# Patient Record
Sex: Female | Born: 1935 | Race: White | Hispanic: No | Smoking: Never smoker
Health system: Southern US, Community
[De-identification: ages and names within clinical notes are randomized; demographics above are authoritative.]

## PROBLEM LIST (undated history)

## (undated) DIAGNOSIS — I4821 Permanent atrial fibrillation: Secondary | ICD-10-CM

## (undated) DIAGNOSIS — G20A1 Parkinson's disease without dyskinesia, without mention of fluctuations: Secondary | ICD-10-CM

## (undated) DIAGNOSIS — Z8781 Personal history of (healed) traumatic fracture: Secondary | ICD-10-CM

## (undated) DIAGNOSIS — Z8489 Family history of other specified conditions: Secondary | ICD-10-CM

## (undated) DIAGNOSIS — M1990 Unspecified osteoarthritis, unspecified site: Secondary | ICD-10-CM

## (undated) DIAGNOSIS — I10 Essential (primary) hypertension: Secondary | ICD-10-CM

## (undated) DIAGNOSIS — G20 Parkinson's disease: Secondary | ICD-10-CM

## (undated) DIAGNOSIS — G8929 Other chronic pain: Secondary | ICD-10-CM

## (undated) DIAGNOSIS — E7800 Pure hypercholesterolemia, unspecified: Secondary | ICD-10-CM

## (undated) DIAGNOSIS — R251 Tremor, unspecified: Secondary | ICD-10-CM

## (undated) DIAGNOSIS — I4891 Unspecified atrial fibrillation: Secondary | ICD-10-CM

## (undated) DIAGNOSIS — Z8619 Personal history of other infectious and parasitic diseases: Secondary | ICD-10-CM

## (undated) DIAGNOSIS — M549 Dorsalgia, unspecified: Secondary | ICD-10-CM

## (undated) HISTORY — DX: Personal history of other infectious and parasitic diseases: Z86.19

## (undated) HISTORY — PX: CATARACT EXTRACTION W/ INTRAOCULAR LENS  IMPLANT, BILATERAL: SHX1307

---
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
## 2009-08-26 DIAGNOSIS — Z8781 Personal history of (healed) traumatic fracture: Secondary | ICD-10-CM

## 2009-08-26 HISTORY — DX: Personal history of (healed) traumatic fracture: Z87.81

## 2013-05-11 ENCOUNTER — Emergency Department (HOSPITAL_COMMUNITY): Payer: Medicare Other

## 2013-05-11 ENCOUNTER — Emergency Department (HOSPITAL_COMMUNITY)
Admission: EM | Admit: 2013-05-11 | Discharge: 2013-05-11 | Disposition: A | Discharge status: Home / Self Care | Payer: Medicare Other | Attending: Emergency Medicine | Admitting: Emergency Medicine

## 2013-05-11 ENCOUNTER — Encounter (HOSPITAL_COMMUNITY): Payer: Self-pay | Admitting: Emergency Medicine

## 2013-05-11 DIAGNOSIS — E7800 Pure hypercholesterolemia, unspecified: Secondary | ICD-10-CM | POA: Insufficient documentation

## 2013-05-11 DIAGNOSIS — IMO0002 Reserved for concepts with insufficient information to code with codable children: Secondary | ICD-10-CM | POA: Insufficient documentation

## 2013-05-11 DIAGNOSIS — Y92009 Unspecified place in unspecified non-institutional (private) residence as the place of occurrence of the external cause: Secondary | ICD-10-CM | POA: Insufficient documentation

## 2013-05-11 DIAGNOSIS — Z8781 Personal history of (healed) traumatic fracture: Secondary | ICD-10-CM | POA: Insufficient documentation

## 2013-05-11 DIAGNOSIS — S0003XA Contusion of scalp, initial encounter: Secondary | ICD-10-CM | POA: Insufficient documentation

## 2013-05-11 DIAGNOSIS — I10 Essential (primary) hypertension: Secondary | ICD-10-CM | POA: Insufficient documentation

## 2013-05-11 DIAGNOSIS — Y939 Activity, unspecified: Secondary | ICD-10-CM | POA: Insufficient documentation

## 2013-05-11 DIAGNOSIS — W1809XA Striking against other object with subsequent fall, initial encounter: Secondary | ICD-10-CM | POA: Insufficient documentation

## 2013-05-11 DIAGNOSIS — Z79899 Other long term (current) drug therapy: Secondary | ICD-10-CM | POA: Insufficient documentation

## 2013-05-11 DIAGNOSIS — W19XXXA Unspecified fall, initial encounter: Secondary | ICD-10-CM

## 2013-05-11 DIAGNOSIS — Z7982 Long term (current) use of aspirin: Secondary | ICD-10-CM | POA: Insufficient documentation

## 2013-05-11 HISTORY — DX: Essential (primary) hypertension: I10

## 2013-05-11 HISTORY — DX: Pure hypercholesterolemia, unspecified: E78.00

## 2013-05-11 HISTORY — DX: Tremor, unspecified: R25.1

## 2013-05-11 HISTORY — DX: Personal history of (healed) traumatic fracture: Z87.81

## 2013-05-11 NOTE — ED Notes (Signed)
Dressing and bacitracin placed on head and L elbow after cleaning with saline; bacitracin and bandaid placed on R elbow

## 2013-05-11 NOTE — ED Notes (Signed)
Pt from home c/o fall from standing on concrete. Per ems pt has "goose egg" on occipital region, and scrapes to elbows. Pt ambulatory on scene, denies loc.

## 2013-05-11 NOTE — ED Provider Notes (Signed)
CSN: [id number]     Arrival date & time 05/11/13  1420 History   First MD Initiated Contact with Patient 05/11/13 1508     Chief Complaint  Patient presents with  . Fall   (Consider location/radiation/quality/duration/timing/severity/associated sxs/prior Treatment) HPI.... accidental fall striking the back of head on concrete a brief time ago.  No loss of consciousness or neuro deficits.  Severity is mild. Palpation makes symptoms worse.  No neck pain, hip pain, extremity pain.  Daughter-in-law confirms normal behavior  Past Medical History  Diagnosis Date  . Hypertension   . Tremor   . High cholesterol   . History of pelvic fracture    History reviewed. No pertinent past surgical history. History reviewed. No pertinent family history. History  Substance Use Topics  . Smoking status: Never Smoker   . Smokeless tobacco: Not on file  . Alcohol Use: No   OB History   Grav Para Term Preterm Abortions TAB SAB Ect Mult Living                 Review of Systems  All other systems reviewed and are negative.    Allergies  Review of patient's allergies indicates no known allergies.  Home Medications   Current Outpatient Rx  Name  Route  Sig  Dispense  Refill  . aspirin 81 MG tablet   Oral   Take 81 mg by mouth daily.         . benazepril-hydrochlorthiazide (LOTENSIN HCT) 20-25 MG per tablet   Oral   Take 1 tablet by mouth daily.         . Cholecalciferol (VITAMIN D-3 PO)   Oral   Take 1 tablet by mouth daily.         . metoprolol succinate (TOPROL-XL) 100 MG 24 hr tablet   Oral   Take 100 mg by mouth daily. Take with or immediately following a meal.         . Omega-3 Fatty Acids (FISH OIL PO)   Oral   Take 1 capsule by mouth daily.         . primidone (MYSOLINE) 50 MG tablet   Oral   Take 100 mg by mouth daily.         . simvastatin (ZOCOR) 20 MG tablet   Oral   Take 20 mg by mouth at bedtime.          BP 163/96  Pulse 33  Temp(Src) 97.6 F  (36.4 C) (Oral)  Resp 18  SpO2 87% Physical Exam  Nursing note and vitals reviewed. Constitutional: She is oriented to person, place, and time. She appears well-developed and well-nourished.  HENT:  Head: Normocephalic and atraumatic.  Right occipital hematoma approximately 2.5 cm in diameter  Eyes: Conjunctivae and EOM are normal. Pupils are equal, round, and reactive to light.  Neck: Normal range of motion. Neck supple.  Cardiovascular: Normal rate, regular rhythm and normal heart sounds.   Pulmonary/Chest: Effort normal and breath sounds normal.  Abdominal: Soft. Bowel sounds are normal.  Musculoskeletal: Normal range of motion.  Neurological: She is alert and oriented to person, place, and time.  Skin:  Superficial abrasions bilateral elbows  Psychiatric: She has a normal mood and affect.    ED Course  Procedures (including critical care time)..... procedure note:   Occipital wound examined. Hair trimmed.  No suture necessary. Turban dressing applied  Labs Review Labs Reviewed - No data to display Imaging Review No results found.  Date: 05/11/2013  Rate: 83  Rhythm: a fib  QRS Axis: normal  Intervals: normal  ST/T Wave abnormalities: normal  Conduction Disutrbances: none  Narrative Interpretation: unremarkable  Ct Head Wo Contrast  05/11/2013   CLINICAL DATA:  79 year old female status post fall with occipital hematoma.  EXAM: CT HEAD WITHOUT CONTRAST  TECHNIQUE: Contiguous axial images were obtained from the base of the skull through the vertex without intravenous contrast.  COMPARISON:  None.  FINDINGS: Posterior scalp hematoma to the right of midline measuring up to 15 mm in thickness. Small volume associated subcutaneous gas. Underlying calvarium intact. Osteopenia throughout the visible skull. No definite suspicious osseous lesion.  Scalp and orbits soft tissues elsewhere are within normal limits. Left greater than right frontal sinus mucosal thickening. Other  Visualized paranasal sinuses and mastoids are clear.  Calcified atherosclerosis at the skull base. Mild intracranial artery dolichoectasia. Patchy in confluent cerebral white matter hypodensity. No ventriculomegaly. No midline shift, mass effect, or evidence of intracranial mass lesion. No acute intracranial hemorrhage identified. No evidence of cortically based acute infarction identified. No suspicious intracranial vascular hyperdensity.  IMPRESSION: 1. Scalp hematoma/laceration without underlying fracture.  2. Nonspecific cerebral white matter changes. No acute intracranial abnormality.   Electronically Signed   By: Augusto Gamble M.D.   On: 05/11/2013 15:58    MDM  No diagnosis found. Patient is alert and oriented x3. CT scan of head negative for subdural hematoma.  Turban dressing applied. No neck pain    Donnetta Hutching, MD 05/11/13 [id number]

## 2013-05-11 NOTE — ED Notes (Signed)
Dr Adriana Simas at bedside updating pt

## 2013-05-11 NOTE — ED Notes (Signed)
Pt to CT via stretcher with transport.

## 2013-05-11 NOTE — ED Notes (Signed)
Notified Dr Adriana Simas that supplies at bedside

## 2013-05-11 NOTE — ED Notes (Signed)
Daughter called ,patient is experiencing new symptons.Nausea Dizziness and off balance.Current scriber informed her to bring patient back for recheck per Dr Adriana Simas.

## 2013-05-13 ENCOUNTER — Encounter (HOSPITAL_COMMUNITY): Payer: Self-pay | Admitting: *Deleted

## 2013-05-13 ENCOUNTER — Emergency Department (HOSPITAL_COMMUNITY): Payer: Medicare Other

## 2013-05-13 ENCOUNTER — Emergency Department (HOSPITAL_COMMUNITY)
Admission: EM | Admit: 2013-05-13 | Discharge: 2013-05-13 | Disposition: A | Discharge status: Home / Self Care | Payer: Medicare Other | Attending: Emergency Medicine | Admitting: Emergency Medicine

## 2013-05-13 DIAGNOSIS — Y929 Unspecified place or not applicable: Secondary | ICD-10-CM | POA: Insufficient documentation

## 2013-05-13 DIAGNOSIS — IMO0002 Reserved for concepts with insufficient information to code with codable children: Secondary | ICD-10-CM | POA: Insufficient documentation

## 2013-05-13 DIAGNOSIS — Y9389 Activity, other specified: Secondary | ICD-10-CM | POA: Insufficient documentation

## 2013-05-13 DIAGNOSIS — I10 Essential (primary) hypertension: Secondary | ICD-10-CM | POA: Insufficient documentation

## 2013-05-13 DIAGNOSIS — M545 Low back pain: Secondary | ICD-10-CM

## 2013-05-13 DIAGNOSIS — W19XXXA Unspecified fall, initial encounter: Secondary | ICD-10-CM | POA: Insufficient documentation

## 2013-05-13 DIAGNOSIS — Z79899 Other long term (current) drug therapy: Secondary | ICD-10-CM | POA: Insufficient documentation

## 2013-05-13 DIAGNOSIS — Z8781 Personal history of (healed) traumatic fracture: Secondary | ICD-10-CM | POA: Insufficient documentation

## 2013-05-13 DIAGNOSIS — R259 Unspecified abnormal involuntary movements: Secondary | ICD-10-CM | POA: Insufficient documentation

## 2013-05-13 DIAGNOSIS — E7800 Pure hypercholesterolemia, unspecified: Secondary | ICD-10-CM | POA: Insufficient documentation

## 2013-05-13 DIAGNOSIS — Z7982 Long term (current) use of aspirin: Secondary | ICD-10-CM | POA: Insufficient documentation

## 2013-05-13 NOTE — ED Notes (Signed)
Pt eating sandwich brought in by daughter, pt asked to refrain from eating until we have xray results back. Pt and daughter stated understanding.

## 2013-05-13 NOTE — ED Notes (Signed)
Pt reports being here on Tuesday following a fall, pt was seen here and sent home but pt reports that she is now having lower back pain that was not evaluated on Tuesday. No acute distress noted at triage.

## 2013-05-13 NOTE — ED Notes (Signed)
Nurse tech notified this RN that pt was found eating in room.  This RN reminded family members again of the need to refrain until we have results. Family members verbalized understanding.

## 2013-05-13 NOTE — ED Notes (Signed)
PT and pt's daughter comfortable with d/c instructions. Pt ambulatory from bed to wheelchair with cane without assistance. No prescriptions.

## 2013-05-13 NOTE — ED Provider Notes (Signed)
CSN: [id number]     Arrival date & time 05/13/13  4098 History   First MD Initiated Contact with Patient 05/13/13 1119     Chief Complaint  Patient presents with  . Fall  . Back Pain   (Consider location/radiation/quality/duration/timing/severity/associated sxs/prior Treatment) HPI.... patient evaluated in emergency department on 05/11/2013 by examiner status post fall.   She had a 2.5 cm occipital hematoma. CT scan of head was negative.   She now complains of sharp low back pain. She admits that her back was not bothering her 2 days ago.   No neurological deficits, neck pain, extremity pain.   There is mild to moderate. No radicular symptoms.   Past Medical History  Diagnosis Date  . Hypertension   . Tremor   . High cholesterol   . History of pelvic fracture    History reviewed. No pertinent past surgical history. History reviewed. No pertinent family history. History  Substance Use Topics  . Smoking status: Never Smoker   . Smokeless tobacco: Not on file  . Alcohol Use: No   OB History   Grav Para Term Preterm Abortions TAB SAB Ect Mult Living                 Review of Systems  All other systems reviewed and are negative.    Allergies  Review of patient's allergies indicates no known allergies.  Home Medications   Current Outpatient Rx  Name  Route  Sig  Dispense  Refill  . aspirin 81 MG tablet   Oral   Take 81 mg by mouth daily.         . benazepril-hydrochlorthiazide (LOTENSIN HCT) 20-25 MG per tablet   Oral   Take 1 tablet by mouth daily.         . Cholecalciferol (VITAMIN D-3 PO)   Oral   Take 1 tablet by mouth daily.         . metoprolol succinate (TOPROL-XL) 100 MG 24 hr tablet   Oral   Take 100 mg by mouth daily. Take with or immediately following a meal.         . Omega-3 Fatty Acids (FISH OIL PO)   Oral   Take 1 capsule by mouth daily.         . primidone (MYSOLINE) 50 MG tablet   Oral   Take 100 mg by mouth daily.         .  simvastatin (ZOCOR) 20 MG tablet   Oral   Take 20 mg by mouth at bedtime.          BP 145/90  Pulse 90  Temp(Src) 98.1 F (36.7 C) (Oral)  Resp 14  SpO2 96% Physical Exam  Nursing note and vitals reviewed. Constitutional: She is oriented to person, place, and time. She appears well-developed and well-nourished.  HENT:  Head: Normocephalic and atraumatic.  Eyes: Conjunctivae and EOM are normal. Pupils are equal, round, and reactive to light.  Neck: Normal range of motion. Neck supple.  Cardiovascular: Normal rate, regular rhythm and normal heart sounds.   Pulmonary/Chest: Effort normal and breath sounds normal.  Abdominal: Soft. Bowel sounds are normal.  Musculoskeletal:  Minimal tenderness distal sacrum, proximal coccyx.   No thoracic tenderness.  Neurological: She is alert and oriented to person, place, and time.  Skin: Skin is warm and dry.  Psychiatric: She has a normal mood and affect.    ED Course  Procedures (including critical care time) Labs Review Labs Reviewed -  No data to display Imaging Review Dg Lumbar Spine Complete  05/13/2013   CLINICAL DATA:  Fall, low back pain  EXAM: LUMBAR SPINE - COMPLETE 4+ VIEW  COMPARISON:  None.  FINDINGS: Five lumbar type vertebral bodies.  Lumbar levoscoliosis.  No evidence of fracture or dislocation. Lumbar vertebral body heights are maintained.  Multiple compression deformities are suspected in the lower thoracic spine, at T8 and T10-12, age indeterminate but likely chronic.  Moderate multilevel degenerative changes. Pars defects are possible at L5-S1.  Vascular calcifications.  IMPRESSION: No evidence of fracture or dislocation in the lumbar spine.  Multiple compression deformities are suspected in the lower thoracic spine, at T8 and T10-12, age-indeterminate but likely chronic.  Moderate multilevel degenerative changes.   Electronically Signed   By: Charline Bills M.D.   On: 05/13/2013 13:22   Dg Sacrum/coccyx  05/13/2013    CLINICAL DATA:  Fall, low back pain  EXAM: SACRUM AND COCCYX - 2+ VIEW  COMPARISON:  None.  FINDINGS: Deformity of the right inferior pubic ramus, likely reflecting prior/ chronic fracture.  No evidence of acute fracture or dislocation.  Visualized bony pelvis appears intact.  Mild degenerative changes of the bilateral hips.  Vascular calcifications  IMPRESSION: No evidence of acute fracture dislocation.  Deformity related to prior/chronic fracture of the right inferior pubic ramus.   Electronically Signed   By: Charline Bills M.D.   On: 05/13/2013 13:19   Ct Head Wo Contrast  05/11/2013   CLINICAL DATA:  79 year old female status post fall with occipital hematoma.  EXAM: CT HEAD WITHOUT CONTRAST  TECHNIQUE: Contiguous axial images were obtained from the base of the skull through the vertex without intravenous contrast.  COMPARISON:  None.  FINDINGS: Posterior scalp hematoma to the right of midline measuring up to 15 mm in thickness. Small volume associated subcutaneous gas. Underlying calvarium intact. Osteopenia throughout the visible skull. No definite suspicious osseous lesion.  Scalp and orbits soft tissues elsewhere are within normal limits. Left greater than right frontal sinus mucosal thickening. Other Visualized paranasal sinuses and mastoids are clear.  Calcified atherosclerosis at the skull base. Mild intracranial artery dolichoectasia. Patchy in confluent cerebral white matter hypodensity. No ventriculomegaly. No midline shift, mass effect, or evidence of intracranial mass lesion. No acute intracranial hemorrhage identified. No evidence of cortically based acute infarction identified. No suspicious intracranial vascular hyperdensity.  IMPRESSION: 1. Scalp hematoma/laceration without underlying fracture.  2. Nonspecific cerebral white matter changes. No acute intracranial abnormality.   Electronically Signed   By: Augusto Gamble M.D.   On: 05/11/2013 15:58    MDM   1. Low back pain   Plain films of  lumbar spine and sacrum reveal no acute fractures. Old compression fractures of T8 and T10, 11, 12 were noted.   Patient is nontender in these areas.    Donnetta Hutching, MD 05/13/13 1409

## 2013-10-12 ENCOUNTER — Ambulatory Visit: Payer: Self-pay | Admitting: Internal Medicine

## 2013-10-27 ENCOUNTER — Ambulatory Visit: Payer: Self-pay | Admitting: Internal Medicine

## 2013-11-03 ENCOUNTER — Ambulatory Visit (INDEPENDENT_AMBULATORY_CARE_PROVIDER_SITE_OTHER): Payer: Commercial Managed Care - HMO | Admitting: Internal Medicine

## 2013-11-03 ENCOUNTER — Encounter: Payer: Self-pay | Admitting: Internal Medicine

## 2013-11-03 VITALS — BP 140/78 | HR 72 | Temp 97.2°F | Resp 14 | Ht 60.0 in | Wt 112.2 lb

## 2013-11-03 DIAGNOSIS — R259 Unspecified abnormal involuntary movements: Secondary | ICD-10-CM

## 2013-11-03 DIAGNOSIS — R609 Edema, unspecified: Secondary | ICD-10-CM | POA: Insufficient documentation

## 2013-11-03 DIAGNOSIS — E785 Hyperlipidemia, unspecified: Secondary | ICD-10-CM

## 2013-11-03 DIAGNOSIS — I10 Essential (primary) hypertension: Secondary | ICD-10-CM

## 2013-11-03 DIAGNOSIS — R251 Tremor, unspecified: Secondary | ICD-10-CM

## 2013-11-03 DIAGNOSIS — E559 Vitamin D deficiency, unspecified: Secondary | ICD-10-CM

## 2013-11-03 DIAGNOSIS — I4891 Unspecified atrial fibrillation: Secondary | ICD-10-CM

## 2013-11-03 MED ORDER — PRIMIDONE 50 MG PO TABS: 100.0000 mg | ORAL_TABLET | Freq: Every day | ORAL | Status: DC

## 2013-11-03 MED ORDER — METOPROLOL SUCCINATE ER 100 MG PO TB24: 100.0000 mg | ORAL_TABLET | Freq: Every day | ORAL | Status: DC

## 2013-11-03 MED ORDER — ASPIRIN EC 325 MG PO TBEC: 325.0000 mg | DELAYED_RELEASE_TABLET | Freq: Every day | ORAL | Status: DC

## 2013-11-03 MED ORDER — TETANUS-DIPHTH-ACELL PERTUSSIS 5-2.5-18.5 LF-MCG/0.5 IM SUSP: 0.5000 mL | Freq: Once | INTRAMUSCULAR | Status: DC

## 2013-11-03 MED ORDER — BENAZEPRIL-HYDROCHLOROTHIAZIDE 20-25 MG PO TABS: 1.0000 | ORAL_TABLET | Freq: Every day | ORAL | Status: DC

## 2013-11-03 NOTE — Progress Notes (Signed)
Patient ID: Alejandra Bailey, female   DOB: 1923-05-23, 80 y.o.   MRN: [id number]     Chief Complaint  Patient presents with  . Establish Care    NP-Establish Care  . Immunizations    print Tdap   . other    swelling in both ankles after being up for a while   No Known Allergies  HPI 80 y/o female patient is here to establish care. Se was living in [place] for 50 years and moved to [place] in June 2014.  She resides in independent living. She uses a walker to move around.no falls reported over last 1 month. She was seeing Dr Izola Price in [facility] prior to this. No prior records for review  Review of Systems  Constitutional: Negative for fever, chills, weight loss, malaise/fatigue and diaphoresis.  HENT: Negative for congestion, hearing loss and sore throat.   Eyes: Negative for blurred vision, double vision and discharge. has reading glasses.  Respiratory: Negative for cough, sputum production, shortness of breath and wheezing.   Cardiovascular: Negative for chest pain, palpitations, orthopnea. Has leg swelling which increases towards end of the day Gastrointestinal: Negative for heartburn, nausea, vomiting, abdominal pain, diarrhea and constipation.  Genitourinary: Negative for dysuria, urgency, frequency and flank pain.  Musculoskeletal: Negative for back pain, falls, joint pain and myalgias. uses a walker Skin: Negative for itching and rash.  Neurological:  Negative for dizziness, tingling, focal weakness and headaches.  Psychiatric/Behavioral: Negative for depression and memory loss. The patient is not nervous/anxious.    Past Medical History  Diagnosis Date  . Hypertension   . Tremor   . High cholesterol   . History of pelvic fracture   . History of measles   . History of mumps    Past Surgical History  Procedure Laterality Date  . Tonsillectomy  1934  . Fracture pelvic bone  2011   Current Outpatient Prescriptions on File Prior to Visit  Medication Sig Dispense Refill  .  aspirin 81 MG tablet Take 81 mg by mouth daily.      . benazepril-hydrochlorthiazide (LOTENSIN HCT) 20-25 MG per tablet Take 1 tablet by mouth daily.      . Cholecalciferol (VITAMIN D-3 PO) Take 2,000 Units by mouth daily.       . metoprolol succinate (TOPROL-XL) 100 MG 24 hr tablet Take 100 mg by mouth daily. Take with or immediately following a meal.      . Omega-3 Fatty Acids (FISH OIL PO) Take 1 capsule by mouth daily.      . primidone (MYSOLINE) 50 MG tablet Take 100 mg by mouth daily.       No current facility-administered medications on file prior to visit.   Family History  Problem Relation Age of Onset  . Stroke Father   . Breast cancer Mother   . Gout Son   . Heart Problems Son     Triple Bypass  . Cancer Maternal Grandmother 54    breast cancer  . Cancer Maternal Grandfather 50    stomach cancer   History   Social History  . Marital Status: Widowed    Spouse Name: N/A    Number of Children: N/A  . Years of Education: N/A   Occupational History  . Not on file.   Social History Main Topics  . Smoking status: Never Smoker   . Smokeless tobacco: Not on file  . Alcohol Use: No  . Drug Use: No  . Sexual Activity: Not on file  Other Topics Concern  . Not on file   Social History Narrative  . No narrative on file   Physical exam BP 140/78  Pulse 72  Temp(Src) 97.2 F (36.2 C) (Oral)  Resp 14  Ht 5' (1.524 m)  Wt 112 lb 3.2 oz (50.894 kg)  BMI 21.91 kg/m2  SpO2 98%  General- elderly female in no acute distress Head- atraumatic, normocephalic Eyes- PERRLA, EOMI, no pallor, no icterus, no discharge Mouth- normal mucus membrane, no oral thrush, normal oropharynx Chest- no chest wall deformities, no chest wall tenderness Cardiovascular- irregular heart rate, systolic murmur present, normal distal pulses Respiratory- bilateral clear to auscultation, no wheeze, no rhonchi, no crackles Abdomen- bowel sounds present, soft, non tender, no organomegaly, no  abdominal bruits, no guarding or rigidity, no CVA tenderness Musculoskeletal- able to move all 4 extremities, no spinal and paraspinal tenderness, uses walker, 1+ edema in both legs Neurological- no focal deficit Skin- warm and dry Psychiatry- alert and oriented to person, place and time, normal mood and affect  Labs- none to review  11/03/13 ekg- aib, LVH  Assessment/plan  1. Essential hypertension, benign Continue benazepril-hctz for now. Check bmp - CMP  2. A-fib Rate controlled. Continue metoprolol succinate 100 mg daily. Pt not aware of hisotry of this. Will need to review prior records. Will also increase aspirin to 325 mg daily for now. Wont start coumadin with her fall risk - CBC with Differential  3. Edema Present in both legs. htcz should help some. Pt asked to elevated legs at rest. If no improvement noted, will consider furosemide. Check bmp  4. Unspecified vitamin D deficiency Continue vitamin d supplement  5. Other and unspecified hyperlipidemia Continue fish oil supplement - Lipid Panel  6. Tremors Stable. Continue primidone at current dose

## 2013-11-04 LAB — CBC WITH DIFFERENTIAL/PLATELET
BASOS ABS: 0 10*3/uL (ref 0.0–0.2)
Basos: 1 %
EOS ABS: 0.1 10*3/uL (ref 0.0–0.4)
Eos: 1 %
HCT: 43.9 % (ref 34.0–46.6)
Hemoglobin: 14.6 g/dL (ref 11.1–15.9)
IMMATURE GRANS (ABS): 0 10*3/uL (ref 0.0–0.1)
Immature Granulocytes: 0 %
LYMPHS: 42 %
Lymphocytes Absolute: 2.4 10*3/uL (ref 0.7–3.1)
MCH: 30.3 pg (ref 26.6–33.0)
MCHC: 33.3 g/dL (ref 31.5–35.7)
MCV: 91 fL (ref 79–97)
Monocytes Absolute: 0.5 10*3/uL (ref 0.1–0.9)
Monocytes: 9 %
NEUTROS PCT: 47 %
Neutrophils Absolute: 2.7 10*3/uL (ref 1.4–7.0)
RBC: 4.82 x10E6/uL (ref 3.77–5.28)
RDW: 14.3 % (ref 12.3–15.4)
WBC: 5.7 10*3/uL (ref 3.4–10.8)

## 2013-11-04 LAB — COMPREHENSIVE METABOLIC PANEL
ALBUMIN: 4.4 g/dL (ref 3.5–4.7)
ALT: 12 IU/L (ref 0–32)
AST: 19 IU/L (ref 0–40)
Albumin/Globulin Ratio: 1.8 (ref 1.1–2.5)
Alkaline Phosphatase: 71 IU/L (ref 39–117)
BILIRUBIN TOTAL: 0.6 mg/dL (ref 0.0–1.2)
BUN/Creatinine Ratio: 18 (ref 11–26)
BUN: 10 mg/dL (ref 8–27)
CHLORIDE: 87 mmol/L — AB (ref 97–108)
CO2: 29 mmol/L (ref 18–29)
Calcium: 10.2 mg/dL (ref 8.7–10.3)
Creatinine, Ser: 0.57 mg/dL (ref 0.57–1.00)
GFR calc non Af Amer: 82 mL/min/{1.73_m2} (ref >59)
GFR, EST AFRICAN AMERICAN: 95 mL/min/{1.73_m2} (ref >59)
GLUCOSE: 88 mg/dL (ref 65–99)
Globulin, Total: 2.4 g/dL (ref 1.5–4.5)
Potassium: 4.1 mmol/L (ref 3.5–5.2)
Sodium: 129 mmol/L — ABNORMAL LOW (ref 134–144)
TOTAL PROTEIN: 6.8 g/dL (ref 6.0–8.5)

## 2013-11-04 LAB — LIPID PANEL
CHOLESTEROL TOTAL: 246 mg/dL — AB (ref 100–199)
Chol/HDL Ratio: 2.7 ratio units (ref 0.0–4.4)
HDL: 92 mg/dL (ref >39)
LDL CALC: 141 mg/dL — AB (ref 0–99)
TRIGLYCERIDES: 67 mg/dL (ref 0–149)
VLDL CHOLESTEROL CAL: 13 mg/dL (ref 5–40)

## 2013-11-10 ENCOUNTER — Ambulatory Visit: Payer: Self-pay | Admitting: Internal Medicine

## 2013-11-16 ENCOUNTER — Encounter: Payer: Self-pay | Admitting: Internal Medicine

## 2014-01-14 ENCOUNTER — Telehealth: Payer: Self-pay | Admitting: Internal Medicine

## 2014-01-14 NOTE — Telephone Encounter (Signed)
New patient Medical Record in Clinical folder for next appt..Alejandra Bailey

## 2014-01-19 ENCOUNTER — Ambulatory Visit (INDEPENDENT_AMBULATORY_CARE_PROVIDER_SITE_OTHER): Payer: Commercial Managed Care - HMO | Admitting: Internal Medicine

## 2014-01-19 ENCOUNTER — Encounter: Payer: Self-pay | Admitting: Internal Medicine

## 2014-01-19 VITALS — BP 126/88 | HR 50 | Temp 97.9°F | Wt 113.0 lb

## 2014-01-19 DIAGNOSIS — R0609 Other forms of dyspnea: Secondary | ICD-10-CM

## 2014-01-19 DIAGNOSIS — R0600 Dyspnea, unspecified: Secondary | ICD-10-CM

## 2014-01-19 DIAGNOSIS — R0989 Other specified symptoms and signs involving the circulatory and respiratory systems: Secondary | ICD-10-CM

## 2014-01-19 DIAGNOSIS — R609 Edema, unspecified: Secondary | ICD-10-CM

## 2014-01-19 DIAGNOSIS — J209 Acute bronchitis, unspecified: Secondary | ICD-10-CM

## 2014-01-19 DIAGNOSIS — R600 Localized edema: Secondary | ICD-10-CM

## 2014-01-19 MED ORDER — FUROSEMIDE 20 MG PO TABS: 20.0000 mg | ORAL_TABLET | Freq: Every day | ORAL | Status: DC

## 2014-01-19 MED ORDER — AMOXICILLIN-POT CLAVULANATE 875-125 MG PO TABS: 1.0000 | ORAL_TABLET | Freq: Two times a day (BID) | ORAL | Status: DC

## 2014-01-19 MED ORDER — FUROSEMIDE 20 MG PO TABS: ORAL_TABLET | ORAL | Status: DC

## 2014-01-19 NOTE — Progress Notes (Signed)
Patient ID: Alejandra Bailey, female   DOB: 10/24/1922, 80 y.o.   MRN: [id number]    Chief Complaint  Patient presents with  . Acute Visit    cough, sore throat, leg swelling   No Known Allergies  HPI 80 y/o female patient is here with complaints of cough with sore throat for couple of days. She has some phlegm production with yellow mucus. No runny nose. Has discomfort in her right ear and ocassional trouble breathing with mild exertion. She has also noticed increased swelling in both her legs  ROS Denies chest pain or palpitations Denies nausea or vomiting or abdominal pain Denies urinary complaints occassional chills but no fever Appetite has decreased and she feels tired Feels stuffed in her nose without drainage Denies ear drainage  Medication reviewed. See [facility]  Physical exam BP 126/88  Pulse 50  Temp(Src) 97.9 F (36.6 C) (Oral)  Wt 113 lb (51.256 kg)  SpO2 90%  General- elderly female in no acute distress Head- atraumatic, normocephalic Eyes- PERRLA, EOMI, no pallor, no icterus, no discharge Ears- left ear normal tympanic membrane and normal external ear canal , right ear normal tympanic membrane and normal external ear canal with some cerumen Neck- no lymphadenopathy, no thyromegaly, no jugular vein distension Nose- normal nasal mucosa, has some maxillary sinus tenderness, no frontal sinus tenderness Mouth- normal mucus membrane, no oral thrush, mild oropharyngeal erythema Cardiovascular- normal s1,s2, no murmurs Respiratory- bilateral poor air entry, no wheeze or crackles Abdomen- bowel sounds present, soft, non tender Musculoskeletal- able to move all 4 extremities, using walker, has 2+ edema in legs upto thigh, no skin tear Neurological- no focal deficit Skin- warm and dry Psychiatry- alert and oriented to person, place and time, normal mood and affect  Assessment/plan  1. Leg edema New. Will have her on lasix 20 mg daily for first 3 days and if no  improvement, change to twice a day. Pt understands this. Concern for chf given dyspnea and leg edema. Will check bnp to r/o acute chf. Reviewed prior renal function. To keep legs elevated at rest. Reassess in  2 weeks - Brain Natriuretic Peptide - Basic Metabolic Panel  2. Dyspnea Acute bronchitis vs CHF exacerbation. Lasix started. Also to start augmentin 875 mg bid for a week with probiotic.will get cxr if no improvement - Brain Natriuretic Peptide - Basic Metabolic Panel  3. Acute bronchitis See above. Can take OTC mucinex as needed for cough

## 2014-01-20 LAB — BASIC METABOLIC PANEL
BUN/Creatinine Ratio: 20 (ref 11–26)
BUN: 12 mg/dL (ref 8–27)
CALCIUM: 10.3 mg/dL (ref 8.7–10.3)
CO2: 26 mmol/L (ref 18–29)
Chloride: 88 mmol/L — ABNORMAL LOW (ref 97–108)
Creatinine, Ser: 0.6 mg/dL (ref 0.57–1.00)
GFR calc Af Amer: 93 mL/min/{1.73_m2} (ref >59)
GFR calc non Af Amer: 81 mL/min/{1.73_m2} (ref >59)
Glucose: 83 mg/dL (ref 65–99)
POTASSIUM: 4.1 mmol/L (ref 3.5–5.2)
SODIUM: 129 mmol/L — AB (ref 134–144)

## 2014-01-20 LAB — BRAIN NATRIURETIC PEPTIDE: BNP: 162.3 pg/mL — AB (ref 0.0–100.0)

## 2014-02-01 ENCOUNTER — Ambulatory Visit: Payer: Commercial Managed Care - HMO | Admitting: Internal Medicine

## 2014-02-02 ENCOUNTER — Other Ambulatory Visit: Payer: Self-pay | Admitting: *Deleted

## 2014-02-02 ENCOUNTER — Encounter: Payer: Self-pay | Admitting: *Deleted

## 2014-02-02 DIAGNOSIS — R259 Unspecified abnormal involuntary movements: Secondary | ICD-10-CM

## 2014-02-02 DIAGNOSIS — E7800 Pure hypercholesterolemia, unspecified: Secondary | ICD-10-CM

## 2014-02-02 DIAGNOSIS — I4891 Unspecified atrial fibrillation: Secondary | ICD-10-CM

## 2014-02-02 DIAGNOSIS — I10 Essential (primary) hypertension: Secondary | ICD-10-CM

## 2014-02-03 ENCOUNTER — Other Ambulatory Visit: Payer: Commercial Managed Care - HMO

## 2014-02-03 DIAGNOSIS — E7800 Pure hypercholesterolemia, unspecified: Secondary | ICD-10-CM

## 2014-02-03 DIAGNOSIS — I10 Essential (primary) hypertension: Secondary | ICD-10-CM

## 2014-02-03 DIAGNOSIS — R259 Unspecified abnormal involuntary movements: Secondary | ICD-10-CM

## 2014-02-03 DIAGNOSIS — I4891 Unspecified atrial fibrillation: Secondary | ICD-10-CM

## 2014-02-04 LAB — COMPREHENSIVE METABOLIC PANEL
A/G RATIO: 1.8 (ref 1.1–2.5)
ALK PHOS: 76 IU/L (ref 39–117)
ALT: 16 IU/L (ref 0–32)
AST: 19 IU/L (ref 0–40)
Albumin: 4 g/dL (ref 3.5–4.7)
BUN / CREAT RATIO: 20 (ref 11–26)
BUN: 13 mg/dL (ref 8–27)
CALCIUM: 10.4 mg/dL — AB (ref 8.7–10.3)
CO2: 27 mmol/L (ref 18–29)
Chloride: 91 mmol/L — ABNORMAL LOW (ref 97–108)
Creatinine, Ser: 0.64 mg/dL (ref 0.57–1.00)
GFR calc Af Amer: 91 mL/min/{1.73_m2} (ref >59)
GFR, EST NON AFRICAN AMERICAN: 79 mL/min/{1.73_m2} (ref >59)
Globulin, Total: 2.2 g/dL (ref 1.5–4.5)
Glucose: 81 mg/dL (ref 65–99)
POTASSIUM: 4.1 mmol/L (ref 3.5–5.2)
SODIUM: 132 mmol/L — AB (ref 134–144)
Total Bilirubin: 0.6 mg/dL (ref 0.0–1.2)
Total Protein: 6.2 g/dL (ref 6.0–8.5)

## 2014-02-04 LAB — CBC WITH DIFFERENTIAL/PLATELET
BASOS ABS: 0.1 10*3/uL (ref 0.0–0.2)
Basos: 1 %
EOS ABS: 0.1 10*3/uL (ref 0.0–0.4)
Eos: 2 %
HCT: 40.9 % (ref 34.0–46.6)
HEMOGLOBIN: 14.3 g/dL (ref 11.1–15.9)
IMMATURE GRANS (ABS): 0 10*3/uL (ref 0.0–0.1)
IMMATURE GRANULOCYTES: 0 %
LYMPHS: 43 %
Lymphocytes Absolute: 2.6 10*3/uL (ref 0.7–3.1)
MCH: 30.6 pg (ref 26.6–33.0)
MCHC: 35 g/dL (ref 31.5–35.7)
MCV: 88 fL (ref 79–97)
MONOCYTES: 10 %
Monocytes Absolute: 0.6 10*3/uL (ref 0.1–0.9)
NEUTROS ABS: 2.7 10*3/uL (ref 1.4–7.0)
NEUTROS PCT: 44 %
RBC: 4.67 x10E6/uL (ref 3.77–5.28)
RDW: 13.7 % (ref 12.3–15.4)
WBC: 6.1 10*3/uL (ref 3.4–10.8)

## 2014-02-04 LAB — LIPID PANEL
CHOLESTEROL TOTAL: 256 mg/dL — AB (ref 100–199)
Chol/HDL Ratio: 3.4 ratio units (ref 0.0–4.4)
HDL: 76 mg/dL (ref >39)
LDL CALC: 165 mg/dL — AB (ref 0–99)
TRIGLYCERIDES: 73 mg/dL (ref 0–149)
VLDL Cholesterol Cal: 15 mg/dL (ref 5–40)

## 2014-02-08 ENCOUNTER — Encounter: Payer: Self-pay | Admitting: Internal Medicine

## 2014-02-08 ENCOUNTER — Ambulatory Visit (INDEPENDENT_AMBULATORY_CARE_PROVIDER_SITE_OTHER): Payer: Commercial Managed Care - HMO | Admitting: Internal Medicine

## 2014-02-08 VITALS — BP 138/80 | HR 153 | Temp 98.1°F | Resp 18 | Ht 60.0 in | Wt 109.4 lb

## 2014-02-08 DIAGNOSIS — R251 Tremor, unspecified: Secondary | ICD-10-CM

## 2014-02-08 DIAGNOSIS — I4891 Unspecified atrial fibrillation: Secondary | ICD-10-CM

## 2014-02-08 DIAGNOSIS — E785 Hyperlipidemia, unspecified: Secondary | ICD-10-CM

## 2014-02-08 DIAGNOSIS — R609 Edema, unspecified: Secondary | ICD-10-CM

## 2014-02-08 DIAGNOSIS — R259 Unspecified abnormal involuntary movements: Secondary | ICD-10-CM

## 2014-02-08 DIAGNOSIS — Z0000 Encounter for general adult medical examination without abnormal findings: Secondary | ICD-10-CM

## 2014-02-08 DIAGNOSIS — E559 Vitamin D deficiency, unspecified: Secondary | ICD-10-CM

## 2014-02-08 DIAGNOSIS — I10 Essential (primary) hypertension: Secondary | ICD-10-CM

## 2014-02-08 DIAGNOSIS — Z23 Encounter for immunization: Secondary | ICD-10-CM

## 2014-02-08 LAB — SPECIMEN STATUS REPORT

## 2014-02-08 MED ORDER — FUROSEMIDE 20 MG PO TABS: ORAL_TABLET | ORAL | Status: DC

## 2014-02-08 NOTE — Progress Notes (Signed)
Clock not passed 

## 2014-02-08 NOTE — Progress Notes (Signed)
Patient ID: Alejandra Bailey, female   DOB: August 21, 1923, 80 y.o.   MRN: [id number]    Chief Complaint  Patient presents with  . Annual Exam   No Known Allergies  HPI 80 y.o female patient is here for annual exam. She denies any concerns this visit. She is here with her daughter in law.  She has history of afib, HTN, leg edema, hyperlipidemia, vitamin d deficiency Her edema is somewhat improved but persists Her breathing is better No complaints this visit  Immunization History  Administered Date(s) Administered  . Influenza Split 05/27/2013  . Pneumococcal Polysaccharide-23 11/03/1993  . Td 08/29/2003   She resides in independent living. She uses a walker to move around.no falls reported   Review of Systems  Constitutional: Negative for fever, chills, malaise/fatigue and diaphoresis.  Wt Readings from Last 3 Encounters:  02/08/14 109 lb 6.4 oz (49.624 kg)  01/19/14 113 lb (51.256 kg)  11/03/13 112 lb 3.2 oz (50.894 kg)   HENT: Negative for congestion, hearing loss and sore throat.   Eyes: Negative for blurred vision, double vision and discharge. has reading glasses.   Respiratory: Negative for cough, sputum production, shortness of breath and wheezing.   Cardiovascular: Negative for chest pain, palpitations, orthopnea. Has leg swelling which increases towards end of the day Gastrointestinal: Negative for heartburn, nausea, vomiting, abdominal pain, diarrhea and constipation.  Genitourinary: Negative for dysuria, urgency, frequency and flank pain.  Musculoskeletal: Negative for back pain, falls, joint pain and myalgias. uses a walker Skin: Negative for itching and rash.  Neurological:  Negative for dizziness, tingling, focal weakness and headaches.  Psychiatric/Behavioral: Negative for depression and memory loss. The patient is not nervous/anxious.      Past Medical History  Diagnosis Date  . Hypertension   . Tremor   . High cholesterol   . History of pelvic fracture   .  History of measles   . History of mumps   . A-fib    Past Surgical History  Procedure Laterality Date  . Tonsillectomy  1934  . Fracture pelvic bone  2011   Current Outpatient Prescriptions on File Prior to Visit  Medication Sig Dispense Refill  . aspirin EC 325 MG tablet Take 1 tablet (325 mg total) by mouth daily.  30 tablet  0  . benazepril-hydrochlorthiazide (LOTENSIN HCT) 20-25 MG per tablet Take 1 tablet by mouth daily.  30 tablet  2  . Cholecalciferol (VITAMIN D-3 PO) Take 2,000 Units by mouth daily.       . metoprolol succinate (TOPROL-XL) 100 MG 24 hr tablet Take 1 tablet (100 mg total) by mouth daily. Take with or immediately following a meal.  30 tablet  2  . Omega-3 Fatty Acids (FISH OIL PO) Take 1 capsule by mouth daily.      . primidone (MYSOLINE) 50 MG tablet Take 2 tablets (100 mg total) by mouth daily.  60 tablet  3  . Tdap (BOOSTRIX) 5-2.5-18.5 LF-MCG/0.5 injection Inject 0.5 mLs into the muscle once.  0.5 mL  0   No current facility-administered medications on file prior to visit.   Family History  Problem Relation Age of Onset  . Stroke Father   . Breast cancer Mother   . Gout Son   . Heart Problems Son     Triple Bypass  . Cancer Maternal Grandmother [id number]    breast cancer  . Cancer Maternal Grandfather 50    stomach cancer   History   Social History  . Marital Status:  Widowed    Spouse Name: N/A    Number of Children: N/A  . Years of Education: N/A   Occupational History  . Not on file.   Social History Main Topics  . Smoking status: Never Smoker   . Smokeless tobacco: Not on file  . Alcohol Use: No  . Drug Use: No  . Sexual Activity: Not on file   Other Topics Concern  . Not on file   Social History Narrative  . No narrative on file   Physical exam BP 138/80  Pulse 153  Temp(Src) 98.1 F (36.7 C) (Oral)  Resp 18  Ht 5' (1.524 m)  Wt 109 lb 6.4 oz (49.624 kg)  BMI 21.37 kg/m2  SpO2 96%  General- elderly female in no acute  distress Head- atraumatic, normocephalic Eyes- PERRLA, EOMI, no pallor, no icterus, no discharge Mouth- normal mucus membrane, no oral thrush, normal oropharynx Ears- normal external ear canal and tympanic membrane Chest- no chest wall deformities, no chest wall tenderness Cardiovascular- irregular heart rate, systolic murmur present, normal distal pulses Respiratory- bilateral clear to auscultation, no wheeze, no rhonchi, no crackles Abdomen- bowel sounds present, soft, non tender, no organomegaly, no abdominal bruits, no guarding or rigidity, no CVA tenderness Musculoskeletal- able to move all 4 extremities, no spinal and paraspinal tenderness, uses walker, 1+ edema in both legs, hypertrophied long toe nails Neurological- no focal deficit, normal reflexes, intact vibration and pin prick sensation Skin- warm and dry Psychiatry- alert and oriented to person, place and time, normal mood and affect  Labs-  Lab Results  Component Value Date   WBC 6.1 02/03/2014   HGB 14.3 02/03/2014   HCT 40.9 02/03/2014   MCV 88 02/03/2014   CMP     Component Value Date/Time   NA 132* 02/03/2014 0826   K 4.1 02/03/2014 0826   CL 91* 02/03/2014 0826   CO2 27 02/03/2014 0826   GLUCOSE 81 02/03/2014 0826   BUN 13 02/03/2014 0826   CREATININE 0.64 02/03/2014 0826   CALCIUM 10.4* 02/03/2014 0826   PROT 6.2 02/03/2014 0826   AST 19 02/03/2014 0826   ALT 16 02/03/2014 0826   ALKPHOS 76 02/03/2014 0826   BILITOT 0.6 02/03/2014 0826   GFRNONAA 79 02/03/2014 0826   GFRAA 91 02/03/2014 0826   No results found for this basename: HGBA1C   Lipid Panel     Component Value Date/Time   TRIG 73 02/03/2014 0826   HDL 76 02/03/2014 0826   CHOLHDL 3.4 02/03/2014 0826   LDLCALC 165* 02/03/2014 0826     11/03/13 ekg- aib, LVH  Assessment/plan  1. Other and unspecified hyperlipidemia Dietary changes for now- pt to cut down on her sweets and chocolates. No statin to be started at present  2. Edema Will increase lasix to 30  mg daily, monitor renal function  3. Essential hypertension, benign Continue benazepril-hctz for now.  4. A-fib Rate currently controlled. Continue metoprolol succinate 100 mg daily. Continue aspirin to 325 mg daily for now.   5. Unspecified vitamin D deficiency Continue vitamin d supplement. Fall precautions  6. Tremors of nervous system Stable. Continue primidone at current dose  7. Routine general medical examination at a health care facility the patient was counseled regarding diet, regular sustained exercise for at least 30 minutes 5 times per week, the proper use of sunscreen and protective clothing, tobacco use, and recommended cholesterol, thyroid and diabetes screening. No further mammogram and colonoscopy or pap smear indicated. prevnar provided today.  Has script for tdap. Reviewed about goals of care and encouraged to help arrange for living will.

## 2014-02-09 LAB — TSH: TSH: 2.44 u[IU]/mL (ref 0.450–4.500)

## 2014-02-09 LAB — SPECIMEN STATUS REPORT

## 2014-02-11 ENCOUNTER — Encounter: Payer: Self-pay | Admitting: *Deleted

## 2014-05-07 ENCOUNTER — Other Ambulatory Visit: Payer: Self-pay | Admitting: Internal Medicine

## 2014-05-24 ENCOUNTER — Encounter: Payer: Self-pay | Admitting: Internal Medicine

## 2014-05-24 ENCOUNTER — Ambulatory Visit (INDEPENDENT_AMBULATORY_CARE_PROVIDER_SITE_OTHER): Payer: Commercial Managed Care - HMO | Admitting: Internal Medicine

## 2014-05-24 VITALS — BP 120/78 | HR 86 | Temp 97.6°F | Resp 10 | Ht 60.0 in | Wt 111.0 lb

## 2014-05-24 DIAGNOSIS — H66019 Acute suppurative otitis media with spontaneous rupture of ear drum, unspecified ear: Secondary | ICD-10-CM | POA: Insufficient documentation

## 2014-05-24 DIAGNOSIS — H66013 Acute suppurative otitis media with spontaneous rupture of ear drum, bilateral: Secondary | ICD-10-CM

## 2014-05-24 DIAGNOSIS — H9203 Otalgia, bilateral: Secondary | ICD-10-CM | POA: Insufficient documentation

## 2014-05-24 DIAGNOSIS — H6590 Unspecified nonsuppurative otitis media, unspecified ear: Secondary | ICD-10-CM

## 2014-05-24 DIAGNOSIS — H9209 Otalgia, unspecified ear: Secondary | ICD-10-CM

## 2014-05-24 MED ORDER — PRIMIDONE 50 MG PO TABS: 100.0000 mg | ORAL_TABLET | Freq: Every day | ORAL | Status: DC

## 2014-05-24 MED ORDER — FUROSEMIDE 20 MG PO TABS: ORAL_TABLET | ORAL | Status: DC

## 2014-05-24 MED ORDER — AMOXICILLIN 500 MG PO CAPS: 500.0000 mg | ORAL_CAPSULE | Freq: Three times a day (TID) | ORAL | Status: DC

## 2014-05-24 MED ORDER — CIPROFLOXACIN-DEXAMETHASONE 0.3-0.1 % OT SUSP: 4.0000 [drp] | Freq: Two times a day (BID) | OTIC | Status: DC

## 2014-05-24 NOTE — Progress Notes (Signed)
Patient ID: Alejandra Bailey, female   DOB: 04/25/1923, 80 y.o.   MRN: [id number]    Chief Complaint  Patient presents with  . Acute Visit    discomfort and feels wax building up in the ear   HPI She has been having frequent ear aches and feels wax is building up in her ears. Denies any ear drainage or discharge No runny nose, sore throat or cough No fever or chills Needs refills on her meds No other concerns  ROS No chest pain or dyspnea No nausea or vomiting No ringing in her ears  Current Outpatient Prescriptions on File Prior to Visit  Medication Sig Dispense Refill  . aspirin EC 325 MG tablet Take 1 tablet (325 mg total) by mouth daily.  30 tablet  0  . benazepril-hydrochlorthiazide (LOTENSIN HCT) 20-25 MG per tablet take 1 tablet by mouth once daily  30 tablet  5  . Cholecalciferol (VITAMIN D-3 PO) Take 2,000 Units by mouth daily.       . Omega-3 Fatty Acids (FISH OIL PO) Take 1 capsule by mouth daily.      . metoprolol succinate (TOPROL-XL) 100 MG 24 hr tablet Take 1 tablet (100 mg total) by mouth daily. Take with or immediately following a meal.  30 tablet  2  . Tdap (BOOSTRIX) 5-2.5-18.5 LF-MCG/0.5 injection Inject 0.5 mLs into the muscle once.  0.5 mL  0   No current facility-administered medications on file prior to visit.   Physical Exam  Constitutional: She is oriented to person, place, and time and well-developed, well-nourished, and in no distress. No distress.  HENT:  Head: Normocephalic and atraumatic.  Nose: Nose normal.  Mouth/Throat: Oropharynx is clear and moist. No oropharyngeal exudate.  Serous drainage in both ears, has fluid behind the tympanic membrane with small perforation noted in left ear, tender on otoscopic exam per patient, no cervical lymphadenopathy noted  Eyes: Conjunctivae and EOM are normal. Pupils are equal, round, and reactive to light.  Neck: Normal range of motion. Neck supple. No thyromegaly present.  Cardiovascular: Normal rate and  regular rhythm.   Pulmonary/Chest: Effort normal and breath sounds normal.  Abdominal: Soft. Bowel sounds are normal.  Lymphadenopathy:    She has no cervical adenopathy.  Neurological: She is alert and oriented to person, place, and time.  Skin: Skin is warm and dry. She is not diaphoretic.  Psychiatric: Mood and affect normal.     Assessment/plan  1. Otalgia of both ears From her otitis media, ENT referral, po and otic antibiotics provided. To take prn tylenol for pain  2. Nonsuppurative otitis media, not specified as acute or chronic error  3. Acute suppurative otitis media of both ears with spontaneous rupture of tympanic membranes, recurrence not specified Will start her on ciprofloxacin otic drops bid and po amoxicillin 500 mg tid for now, ENT referral provided

## 2014-07-12 ENCOUNTER — Ambulatory Visit: Payer: Commercial Managed Care - HMO | Admitting: Internal Medicine

## 2014-07-13 ENCOUNTER — Ambulatory Visit: Payer: Commercial Managed Care - HMO | Admitting: Internal Medicine

## 2014-11-19 ENCOUNTER — Other Ambulatory Visit: Payer: Self-pay | Admitting: Internal Medicine

## 2015-01-14 ENCOUNTER — Other Ambulatory Visit: Payer: Self-pay | Admitting: Internal Medicine

## 2015-01-16 ENCOUNTER — Other Ambulatory Visit: Payer: Self-pay | Admitting: Internal Medicine

## 2015-01-19 ENCOUNTER — Other Ambulatory Visit: Payer: Self-pay | Admitting: *Deleted

## 2015-01-19 MED ORDER — FUROSEMIDE 20 MG PO TABS: ORAL_TABLET | ORAL | Status: DC

## 2015-01-19 MED ORDER — BENAZEPRIL-HYDROCHLOROTHIAZIDE 20-25 MG PO TABS: ORAL_TABLET | ORAL | Status: DC

## 2015-01-19 MED ORDER — PRIMIDONE 50 MG PO TABS: ORAL_TABLET | ORAL | Status: AC

## 2015-01-19 NOTE — Telephone Encounter (Signed)
Patient walked in thinking she had an appointment today and it wasn't. Per Shanda BumpsJessica can call in her medications with 1 refill until her appointment. Faxed to pharmacy.

## 2015-02-14 ENCOUNTER — Ambulatory Visit: Payer: Commercial Managed Care - HMO | Admitting: Nurse Practitioner

## 2015-03-06 ENCOUNTER — Other Ambulatory Visit: Payer: Self-pay | Admitting: *Deleted

## 2015-03-06 MED ORDER — BENAZEPRIL-HYDROCHLOROTHIAZIDE 20-25 MG PO TABS: ORAL_TABLET | ORAL | Status: DC

## 2015-03-06 NOTE — Telephone Encounter (Signed)
[facility] [address] 

## 2015-03-07 ENCOUNTER — Ambulatory Visit (INDEPENDENT_AMBULATORY_CARE_PROVIDER_SITE_OTHER): Payer: Commercial Managed Care - HMO | Admitting: Nurse Practitioner

## 2015-03-07 ENCOUNTER — Encounter: Payer: Self-pay | Admitting: Nurse Practitioner

## 2015-03-07 VITALS — BP 128/82 | HR 84 | Temp 98.2°F | Resp 20 | Ht 60.0 in | Wt 112.8 lb

## 2015-03-07 DIAGNOSIS — I10 Essential (primary) hypertension: Secondary | ICD-10-CM | POA: Diagnosis not present

## 2015-03-07 DIAGNOSIS — R251 Tremor, unspecified: Secondary | ICD-10-CM | POA: Diagnosis not present

## 2015-03-07 DIAGNOSIS — I482 Chronic atrial fibrillation, unspecified: Secondary | ICD-10-CM

## 2015-03-07 DIAGNOSIS — Z7189 Other specified counseling: Secondary | ICD-10-CM

## 2015-03-07 DIAGNOSIS — R609 Edema, unspecified: Secondary | ICD-10-CM | POA: Diagnosis not present

## 2015-03-07 NOTE — Progress Notes (Signed)
Patient ID: Alejandra Bailey, female   DOB: 29-Sep-1922, 82 y.o.   MRN: [id number]    PCP: Kirt Boys, DO  No Known Allergies  Chief Complaint  Patient presents with  . Medical Management of Chronic Issues    Follow-up on medications     HPI: Patient is a 81 y.o. female seen in the office today for routine follow up. Pt with history of afib, HTN, leg edema, hyperlipidemia, vitamin d deficiency.  Not taking metoprolol, unsure if she was told to stop this or not.  Not following with cardiologist Living in a senior community. Life alert.  Does exercise group 3 times a week Manages her own medications.  Stopped driving a couple of years ago Slide down in tub a couple of months ago, was checked out and everything okay Using the walker all the time now.  Appetite has been good  Advanced Directive information Does patient have an advance directive?: Yes Review of Systems:  Review of Systems  Constitutional: Negative for activity change, appetite change, fatigue and unexpected weight change.  HENT: Negative for congestion and hearing loss.   Eyes: Negative.   Respiratory: Negative for cough and shortness of breath.   Cardiovascular: Positive for leg swelling. Negative for chest pain and palpitations.  Gastrointestinal: Negative for abdominal pain, diarrhea and constipation.  Genitourinary: Negative for dysuria and difficulty urinating.  Musculoskeletal: Negative for myalgias and arthralgias.  Skin: Negative for color change and wound.  Neurological: Positive for tremors (unchanged). Negative for dizziness and weakness.  Psychiatric/Behavioral: Negative for behavioral problems, confusion and agitation.    Past Medical History  Diagnosis Date  . Hypertension   . Tremor   . High cholesterol   . History of pelvic fracture   . History of measles   . History of mumps   . A-fib    Past Surgical History  Procedure Laterality Date  . Tonsillectomy  1934  . Fracture pelvic bone   2011   Social History:   reports that she has never smoked. She does not have any smokeless tobacco history on file. She reports that she does not drink alcohol or use illicit drugs.  Family History  Problem Relation Age of Onset  . Stroke Father   . Breast cancer Mother   . Gout Son   . Heart Problems Son     Triple Bypass  . Cancer Maternal Grandmother 39    breast cancer  . Cancer Maternal Grandfather 50    stomach cancer    Medications: Patient's Medications  New Prescriptions   No medications on file  Previous Medications   ASPIRIN EC 325 MG TABLET    Take 1 tablet (325 mg total) by mouth daily.   BENAZEPRIL-HYDROCHLORTHIAZIDE (LOTENSIN HCT) 20-25 MG PER TABLET    Take one tablet by mouth once daily for blood pressure   CHOLECALCIFEROL (VITAMIN D-3 PO)    Take 2,000 Units by mouth daily.    FUROSEMIDE (LASIX) 20 MG TABLET    Take 1 and 1/2 tablet by mouth every morning for swelling of your legs.   METOPROLOL SUCCINATE (TOPROL-XL) 100 MG 24 HR TABLET    Take 1 tablet (100 mg total) by mouth daily. Take with or immediately following a meal.   OMEGA-3 FATTY ACIDS (FISH OIL PO)    Take 1 capsule by mouth daily.   PRIMIDONE (MYSOLINE) 50 MG TABLET    Take two tablets by mouth once daily   TDAP (BOOSTRIX) 5-2.5-18.5 LF-MCG/0.5 INJECTION  Inject 0.5 mLs into the muscle once.  Modified Medications   No medications on file  Discontinued Medications   AMOXICILLIN (AMOXIL) 500 MG CAPSULE    Take 1 capsule (500 mg total) by mouth 3 (three) times daily.   CIPROFLOXACIN-DEXAMETHASONE (CIPRODEX) OTIC SUSPENSION    Place 4 drops into both ears 2 (two) times daily.     Physical Exam:  Filed Vitals:   03/07/15 0956  BP: 128/82  Pulse: 84  Temp: 98.2 F (36.8 C)  TempSrc: Oral  Resp: 20  Height: 5' (1.524 m)  Weight: 112 lb 12.8 oz (51.166 kg)  SpO2: 95%    Physical Exam  Constitutional: No distress.  Thin female  HENT:  Head: Normocephalic and atraumatic.    Mouth/Throat: Oropharynx is clear and moist. No oropharyngeal exudate.  Eyes: Conjunctivae are normal. Pupils are equal, round, and reactive to light.  Neck: Normal range of motion. Neck supple.  Cardiovascular: Normal rate, regular rhythm and normal heart sounds.   Pulmonary/Chest: Effort normal and breath sounds normal.  Abdominal: Soft. Bowel sounds are normal.  Musculoskeletal: She exhibits edema (1+ bilaterally to LE). She exhibits no tenderness.  Neurological: She is alert.  Skin: Skin is warm and dry. She is not diaphoretic.  Psychiatric: She has a normal mood and affect.    Labs reviewed: Basic Metabolic Panel: No results for input(s): NA, K, CL, CO2, GLUCOSE, BUN, CREATININE, CALCIUM, MG, PHOS, TSH in the last 8760 hours. Liver Function Tests: No results for input(s): AST, ALT, ALKPHOS, BILITOT, PROT, ALBUMIN in the last 8760 hours. No results for input(s): LIPASE, AMYLASE in the last 8760 hours. No results for input(s): AMMONIA in the last 8760 hours. CBC: No results for input(s): WBC, NEUTROABS, HGB, HCT, MCV, PLT in the last 8760 hours. Lipid Panel: No results for input(s): CHOL, HDL, LDLCALC, TRIG, CHOLHDL, LDLDIRECT in the last 8760 hours. TSH: No results for input(s): TSH in the last 8760 hours. A1C: No results found for: HGBA1C   Assessment/Plan 1. Chronic atrial fibrillation -rate controlled, conts on ASA 325 mg daily -off metoprolol  - CBC with Differential  2. Edema -cont on lasix and HCTZ  -encouraged elevation of LE and use of TED - Comprehensive metabolic panel  3. Essential hypertension, benign -stable at this time, not currently taking metoprolol  -conts on benazepril-HCTZ - Comprehensive metabolic panel  4. Tremors of nervous system -remains stable, conts on primidone, does not affect ADLs other than witting, able to feed, dress and bath herself without difficulty   5. Advanced care planning/counseling discussion -to bring in copy of living  will - DNR (Do Not Resuscitate)   Follow up in 3 months for EV with MMSE  Debbra Digiulio K. Biagio BorgEubanks, AGNP  [facility] [phone](Monday-Friday 8 am - 5 pm) [phone] (after hours)

## 2015-03-07 NOTE — Patient Instructions (Signed)
Elevate legs when sitting May use TED hose or compression hose (can pick them up at medical supply store) on in the morning and to take off at bedtime  Follow up in 3 months for physical

## 2015-03-08 LAB — COMPREHENSIVE METABOLIC PANEL
ALBUMIN: 4.3 g/dL (ref 3.2–4.6)
ALT: 16 IU/L (ref 0–32)
AST: 20 IU/L (ref 0–40)
Albumin/Globulin Ratio: 1.5 (ref 1.1–2.5)
Alkaline Phosphatase: 68 IU/L (ref 39–117)
BUN / CREAT RATIO: 16 (ref 11–26)
BUN: 11 mg/dL (ref 10–36)
Bilirubin Total: 0.5 mg/dL (ref 0.0–1.2)
CALCIUM: 10.1 mg/dL (ref 8.7–10.3)
CO2: 27 mmol/L (ref 18–29)
CREATININE: 0.67 mg/dL (ref 0.57–1.00)
Chloride: 90 mmol/L — ABNORMAL LOW (ref 97–108)
GFR, EST AFRICAN AMERICAN: 89 mL/min/{1.73_m2} (ref >59)
GFR, EST NON AFRICAN AMERICAN: 78 mL/min/{1.73_m2} (ref >59)
Globulin, Total: 2.9 g/dL (ref 1.5–4.5)
Glucose: 86 mg/dL (ref 65–99)
POTASSIUM: 4.3 mmol/L (ref 3.5–5.2)
Sodium: 133 mmol/L — ABNORMAL LOW (ref 134–144)
Total Protein: 7.2 g/dL (ref 6.0–8.5)

## 2015-03-08 LAB — CBC WITH DIFFERENTIAL/PLATELET
BASOS: 1 %
Basophils Absolute: 0.1 10*3/uL (ref 0.0–0.2)
EOS (ABSOLUTE): 0.1 10*3/uL (ref 0.0–0.4)
Eos: 1 %
HEMOGLOBIN: 13.9 g/dL (ref 11.1–15.9)
Hematocrit: 41 % (ref 34.0–46.6)
IMMATURE GRANULOCYTES: 0 %
Immature Grans (Abs): 0 10*3/uL (ref 0.0–0.1)
LYMPHS: 44 %
Lymphocytes Absolute: 2.3 10*3/uL (ref 0.7–3.1)
MCH: 30.4 pg (ref 26.6–33.0)
MCHC: 33.9 g/dL (ref 31.5–35.7)
MCV: 90 fL (ref 79–97)
MONOCYTES: 10 %
Monocytes Absolute: 0.5 10*3/uL (ref 0.1–0.9)
Neutrophils Absolute: 2.4 10*3/uL (ref 1.4–7.0)
Neutrophils: 44 %
PLATELETS: 284 10*3/uL (ref 150–379)
RBC: 4.57 x10E6/uL (ref 3.77–5.28)
RDW: 14.3 % (ref 12.3–15.4)
WBC: 5.4 10*3/uL (ref 3.4–10.8)

## 2015-03-20 ENCOUNTER — Telehealth: Payer: Self-pay

## 2015-03-20 NOTE — Telephone Encounter (Signed)
Spoke with patient, she denied SOB, but could hear her breathing, seemed a little labor. Told patient Dr. Renato Gails recommend going to Urgent Care, have her son take her.

## 2015-03-20 NOTE — Telephone Encounter (Signed)
I am concerned about these symptoms.  Please refer her to urgent care if we don't have anymore appts.  Someone should see her (could be a blood clot or aneurysm).

## 2015-03-20 NOTE — Telephone Encounter (Signed)
Patient's daughter called to schedule appointment for tomorrow, no available appointments. Patient with upper back pain since Saturday and SOB (a little more than usual). Patient tried OTC medication with no relief. Patient with no known injury.  Please advise

## 2015-03-21 ENCOUNTER — Emergency Department (HOSPITAL_COMMUNITY): Payer: Commercial Managed Care - HMO

## 2015-03-21 ENCOUNTER — Telehealth: Payer: Self-pay | Admitting: *Deleted

## 2015-03-21 ENCOUNTER — Emergency Department (HOSPITAL_COMMUNITY)
Admission: EM | Admit: 2015-03-21 | Discharge: 2015-03-21 | Disposition: A | Discharge status: Home / Self Care | Payer: Commercial Managed Care - HMO | Attending: Emergency Medicine | Admitting: Emergency Medicine

## 2015-03-21 ENCOUNTER — Encounter (HOSPITAL_COMMUNITY): Payer: Self-pay

## 2015-03-21 DIAGNOSIS — Z79899 Other long term (current) drug therapy: Secondary | ICD-10-CM | POA: Insufficient documentation

## 2015-03-21 DIAGNOSIS — I10 Essential (primary) hypertension: Secondary | ICD-10-CM | POA: Diagnosis not present

## 2015-03-21 DIAGNOSIS — G20 Parkinson's disease: Secondary | ICD-10-CM | POA: Diagnosis not present

## 2015-03-21 DIAGNOSIS — M546 Pain in thoracic spine: Secondary | ICD-10-CM | POA: Diagnosis present

## 2015-03-21 DIAGNOSIS — I4819 Other persistent atrial fibrillation: Secondary | ICD-10-CM

## 2015-03-21 DIAGNOSIS — Z8781 Personal history of (healed) traumatic fracture: Secondary | ICD-10-CM | POA: Insufficient documentation

## 2015-03-21 DIAGNOSIS — M4854XA Collapsed vertebra, not elsewhere classified, thoracic region, initial encounter for fracture: Secondary | ICD-10-CM

## 2015-03-21 DIAGNOSIS — Z7982 Long term (current) use of aspirin: Secondary | ICD-10-CM | POA: Insufficient documentation

## 2015-03-21 DIAGNOSIS — I481 Persistent atrial fibrillation: Secondary | ICD-10-CM | POA: Diagnosis not present

## 2015-03-21 DIAGNOSIS — Z8619 Personal history of other infectious and parasitic diseases: Secondary | ICD-10-CM | POA: Diagnosis not present

## 2015-03-21 DIAGNOSIS — M8448XA Pathological fracture, other site, initial encounter for fracture: Secondary | ICD-10-CM | POA: Diagnosis not present

## 2015-03-21 DIAGNOSIS — E780 Pure hypercholesterolemia: Secondary | ICD-10-CM | POA: Insufficient documentation

## 2015-03-21 HISTORY — DX: Parkinson's disease: G20

## 2015-03-21 HISTORY — DX: Parkinson's disease without dyskinesia, without mention of fluctuations: G20.A1

## 2015-03-21 LAB — BASIC METABOLIC PANEL
Anion gap: 13 (ref 5–15)
BUN: 20 mg/dL (ref 6–20)
CALCIUM: 9.8 mg/dL (ref 8.9–10.3)
CHLORIDE: 98 mmol/L — AB (ref 101–111)
CO2: 22 mmol/L (ref 22–32)
Creatinine, Ser: 0.65 mg/dL (ref 0.44–1.00)
GFR calc Af Amer: >60 mL/min (ref >60)
Glucose, Bld: 99 mg/dL (ref 65–99)
Potassium: 3.9 mmol/L (ref 3.5–5.1)
Sodium: 133 mmol/L — ABNORMAL LOW (ref 135–145)

## 2015-03-21 LAB — CBC
HEMATOCRIT: 39.8 % (ref 36.0–46.0)
Hemoglobin: 13.5 g/dL (ref 12.0–15.0)
MCH: 31 pg (ref 26.0–34.0)
MCHC: 33.9 g/dL (ref 30.0–36.0)
MCV: 91.3 fL (ref 78.0–100.0)
Platelets: 239 10*3/uL (ref 150–400)
RBC: 4.36 MIL/uL (ref 3.87–5.11)
RDW: 14.1 % (ref 11.5–15.5)
WBC: 7 10*3/uL (ref 4.0–10.5)

## 2015-03-21 LAB — I-STAT TROPONIN, ED: Troponin i, poc: 0.01 ng/mL (ref 0.00–0.08)

## 2015-03-21 MED ORDER — MORPHINE SULFATE 2 MG/ML IJ SOLN
2.0000 mg | Freq: Once | INTRAMUSCULAR | Status: AC
  Administered 2015-03-21: 2 mg via INTRAVENOUS
  Filled 2015-03-21: qty 1

## 2015-03-21 MED ORDER — HYDROCODONE-ACETAMINOPHEN 5-325 MG PO TABS: 1.0000 | ORAL_TABLET | Freq: Four times a day (QID) | ORAL | Status: DC | PRN

## 2015-03-21 MED ORDER — IOHEXOL 350 MG/ML SOLN
80.0000 mL | Freq: Once | INTRAVENOUS | Status: AC | PRN
  Administered 2015-03-21: 80 mL via INTRAVENOUS

## 2015-03-21 MED ORDER — METOPROLOL TARTRATE 25 MG PO TABS: 25.0000 mg | ORAL_TABLET | Freq: Two times a day (BID) | ORAL | Status: DC

## 2015-03-21 MED ORDER — METOPROLOL TARTRATE 25 MG PO TABS
25.0000 mg | ORAL_TABLET | Freq: Once | ORAL | Status: AC
  Administered 2015-03-21: 25 mg via ORAL
  Filled 2015-03-21: qty 1

## 2015-03-21 NOTE — Telephone Encounter (Signed)
Spoke with patient's daughter- in law concerning her back pain and leg swelling, she stated that they were going to take her to the urgent care this morning since we did not have any available openings.     Hi,    I'm writing on behalf of my Mother-in-Law, Medical laboratory scientific officer. I called to see if she could be seen for some back pain that she has been having since Saturday morning.( I believe the person I spoke to said her name was [name] (?). ) I was told that she would call me back, but I never heard from her. I will call again in the morning. If no one there can see Alejandra Bailey, can you suggest somewhere she could be seen?     Thank you

## 2015-03-21 NOTE — ED Notes (Signed)
Family at bedside. 

## 2015-03-21 NOTE — ED Notes (Signed)
Patient is alert and orientedx4.  Patient was explained discharge instructions and they understood them with no questions.  The patient's son, Alejandra Bailey is taking the patient home.

## 2015-03-21 NOTE — ED Provider Notes (Signed)
CSN: [id number]     Arrival date & time 03/21/15  1102 History   First MD Initiated Contact with Patient 03/21/15 1155     Chief Complaint  Patient presents with  . Back Pain  . Tachycardia     (Consider location/radiation/quality/duration/timing/severity/associated sxs/prior Treatment) HPI Comments: 81 year old female with history of atrial fibrillation on aspirin, high blood pressure, compression fracture presents with worsening thoracic back pain midline paraspinal the past 4 days. This is worse than pain she's had in the past. No fevers or chills. No neurologic complaints. No chest pain or shortness of breath no recent surgeries no blood clot history. No falls. Patient was not sure if she had atrial fibrillation the past, denies taking metoprolol currently but was on it per records. Back pain worse with movement  The history is provided by the patient.    Past Medical History  Diagnosis Date  . Hypertension   . Tremor   . High cholesterol   . History of pelvic fracture   . History of measles   . History of mumps   . A-fib   . Parkinson disease    Past Surgical History  Procedure Laterality Date  . Tonsillectomy  1934  . Fracture pelvic bone  2011   Family History  Problem Relation Age of Onset  . Stroke Father   . Breast cancer Mother   . Gout Son   . Heart Problems Son     Triple Bypass  . Cancer Maternal Grandmother 67    breast cancer  . Cancer Maternal Grandfather 50    stomach cancer   History  Substance Use Topics  . Smoking status: Never Smoker   . Smokeless tobacco: Not on file  . Alcohol Use: No   OB History    No data available     Review of Systems  Constitutional: Negative for fever and chills.  HENT: Negative for congestion.   Eyes: Negative for visual disturbance.  Respiratory: Negative for shortness of breath.   Cardiovascular: Negative for chest pain.  Gastrointestinal: Negative for vomiting and abdominal pain.  Genitourinary: Negative  for dysuria and flank pain.  Musculoskeletal: Positive for back pain. Negative for neck pain and neck stiffness.  Skin: Negative for rash.  Neurological: Negative for light-headedness and headaches.      Allergies  Review of patient's allergies indicates no known allergies.  Home Medications   Prior to Admission medications   Medication Sig Start Date End Date Taking? Authorizing Provider  aspirin EC 325 MG tablet Take 1 tablet (325 mg total) by mouth daily. 11/03/13  Yes Mahima Glade Lloyd, MD  benazepril-hydrochlorthiazide (LOTENSIN HCT) 20-25 MG per tablet Take one tablet by mouth once daily for blood pressure 03/06/15  Yes Kirt Boys, DO  Cholecalciferol (VITAMIN D-3 PO) Take 2,000 Units by mouth daily.    Yes Historical Provider, MD  furosemide (LASIX) 20 MG tablet Take 1 and 1/2 tablet by mouth every morning for swelling of your legs. 01/19/15  Yes Sharon Seller, NP  naproxen sodium (ANAPROX) 220 MG tablet Take 220 mg by mouth 2 (two) times daily as needed (pain).   Yes Historical Provider, MD  Omega-3 Fatty Acids (FISH OIL PO) Take 1 capsule by mouth daily.   Yes Historical Provider, MD  primidone (MYSOLINE) 50 MG tablet Take two tablets by mouth once daily 01/19/15  Yes Sharon Seller, NP  HYDROcodone-acetaminophen (NORCO) 5-325 MG per tablet Take 1 tablet by mouth every 6 (six) hours as needed. 03/21/15  Blane Ohara, MD  metoprolol tartrate (LOPRESSOR) 25 MG tablet Take 1 tablet (25 mg total) by mouth 2 (two) times daily. 03/21/15   Blane Ohara, MD  Tdap Leda Min) 5-2.5-18.5 LF-MCG/0.5 injection Inject 0.5 mLs into the muscle once. Patient not taking: Reported on 03/07/2015 11/03/13   Oneal Grout, MD   BP 130/73 mmHg  Pulse 99  Temp(Src) 99.7 F (37.6 C) (Oral)  Resp 23  SpO2 94% Physical Exam  Constitutional: She is oriented to person, place, and time. She appears well-developed and well-nourished.  HENT:  Head: Normocephalic and atraumatic.  Eyes: Conjunctivae are  normal. Right eye exhibits no discharge. Left eye exhibits no discharge.  Neck: Normal range of motion. Neck supple. No tracheal deviation present.  Cardiovascular: Normal rate and regular rhythm.   Pulmonary/Chest: Effort normal and breath sounds normal.  Abdominal: Soft. She exhibits no distension. There is no tenderness. There is no guarding.  Musculoskeletal: She exhibits tenderness. She exhibits no edema.  Patient has moderate tenderness paraspinal and midline upper thoracic back  Neurological: She is alert and oriented to person, place, and time. GCS eye subscore is 4. GCS verbal subscore is 5. GCS motor subscore is 6.  Patient has 5+ strength in all extremities flexion-extension major joints sensation intact palpation bilateral  Skin: Skin is warm. No rash noted.  Psychiatric: She has a normal mood and affect.  Nursing note and vitals reviewed.   ED Course  Procedures (including critical care time) Labs Review Labs Reviewed  BASIC METABOLIC PANEL - Abnormal; Notable for the following:    Sodium 133 (*)    Chloride 98 (*)    All other components within normal limits  CBC  I-STAT TROPOININ, ED    Imaging Review Dg Chest 2 View  03/21/2015   CLINICAL DATA:  Tachycardia since Saturday in  EXAM: CHEST  2 VIEW  COMPARISON:  None.  FINDINGS: The mediastinal contour is normal. The heart size is not enlarged. The aorta is tortuous. Both lungs are clear. Multiple compression deformities throughout the thoracic spine with scoliosis spine are noted.  IMPRESSION: No active cardiopulmonary disease.   Electronically Signed   By: Sherian Rein M.D.   On: 03/21/2015 13:10   Dg Thoracic Spine 2 View  03/21/2015   CLINICAL DATA:  81 year old female with upper back pain and tachycardia starting 3 days ago. No injury. Initial encounter.  EXAM: THORACIC SPINE 2 VIEWS  COMPARISON:  No comparison thoracic spine exam. Comparison lumbar spine exam 05/13/2013.  FINDINGS: Kyphoscoliosis of the thoracic  spine with several compression deformities T7 through T11 with up to 90% loss height involving the T11 vertebral body. Degree of compression fractures have progressed when compared to the prior lumbar spine plain film exam.  40% loss height T4 vertebral body.  Age indeterminate.  Cardiomegaly.  Calcified tortuous aorta.  IMPRESSION: Kyphoscoliosis of the thoracic spine with several compression deformities T7 through T11 with up to 90% loss height involving the T11 vertebral body. Degree of compression fractures have progressed when compared to the prior lumbar spine plain film exam.  40% loss height T4 vertebral body.  Age indeterminate.  Calcified tortuous aorta. If there is any clinical suspicion of thoracic aortic abnormality contributing to the patient's symptoms, CT angiogram may be considered.   Electronically Signed   By: Lacy Duverney M.D.   On: 03/21/2015 13:20     EKG Interpretation None     EKG reviewed heart rate 126 irregular rhythm consistent with age of fibrillation nonspecific  ST and T wave changes, rapid heart rate response MDM   Final diagnoses:  Compression fracture of thoracic spine, non-traumatic, initial encounter  Midline thoracic back pain  Acute thoracic back pain  Persistent atrial fibrillation   Elderly patient presents with worsening back pain clinical concern for compression fracture/musculoskeletal however with age and pain more significant than ever in the past as well as abnormal x-ray plan for CT angiogram to rule out dissection. Patient has equal pulses upper and lower extremities. Kidney function reviewed.  Patient's pain improved and recheck. We discussed risks and benefits of CT scan looking for dissection/aneurysm. Patient decided she wanted to pursue to know if that is the diagnosis she would not be a surgical candidate however she would be medical management.  Spoke with cardiology regarding atrial fibrillation they'll follow-up outpatient rate controlled  currently, plan for metoprolol and aspirin.   Medications  morphine 2 MG/ML injection 2 mg (2 mg Intravenous Given 03/21/15 1329)  metoprolol tartrate (LOPRESSOR) tablet 25 mg (25 mg Oral Given 03/21/15 1328)  iohexol (OMNIPAQUE) 350 MG/ML injection 80 mL (80 mLs Intravenous Contrast Given 03/21/15 1619)    Filed Vitals:   03/21/15 1500 03/21/15 1515 03/21/15 1530 03/21/15 1540  BP: 118/63 123/73 130/73 130/73  Pulse: 93 78 69 99  Temp:    99.7 F (37.6 C)  TempSrc:    Oral  Resp: SpO2: 93% 97% 95% 94%    Final diagnoses:  Compression fracture of thoracic spine, non-traumatic, initial encounter  Midline thoracic back pain  Acute thoracic back pain  Persistent atrial fibrillation        Blane Ohara, MD 03/21/15 1635

## 2015-03-21 NOTE — ED Notes (Signed)
Patient transported to CT 

## 2015-03-21 NOTE — ED Notes (Signed)
Sent from [facility] for upper [facility] pain and tachycardia-- pt states back started hurting on Saturday.Alejandra Bailey

## 2015-03-21 NOTE — Discharge Instructions (Signed)
Take Tylenol and use ice for pain as needed. For severe pain take norco or vicodin however realize they have the potential for addiction and it can make you sleepy and has tylenol in it.  No operating machinery while taking. Take metoprolol and follow up with Cardiology.  Cardiology will follow you up.  Take aspirin daily. If you were given medicines take as directed.  If you are on coumadin or contraceptives realize their levels and effectiveness is altered by many different medicines.  If you have any reaction (rash, tongues swelling, other) to the medicines stop taking and see a physician.    If your blood pressure was elevated in the ER make sure you follow up for management with a primary doctor or return for chest pain, shortness of breath or stroke symptoms.  Please follow up as directed and return to the ER or see a physician for new or worsening symptoms.  Thank you. Filed Vitals:   03/21/15 1345 03/21/15 1400 03/21/15 1430 03/21/15 1445  BP: 133/78 133/70 128/83 121/69  Pulse: 93 91 80 95  Temp:      TempSrc:      Resp: SpO2: 97% 96% 98% 94%

## 2015-03-23 ENCOUNTER — Ambulatory Visit (INDEPENDENT_AMBULATORY_CARE_PROVIDER_SITE_OTHER): Payer: Commercial Managed Care - HMO | Admitting: Internal Medicine

## 2015-03-23 ENCOUNTER — Encounter: Payer: Self-pay | Admitting: Internal Medicine

## 2015-03-23 VITALS — BP 104/68 | HR 91 | Temp 97.9°F | Resp 20 | Ht 60.0 in | Wt 111.0 lb

## 2015-03-23 DIAGNOSIS — S22000A Wedge compression fracture of unspecified thoracic vertebra, initial encounter for closed fracture: Secondary | ICD-10-CM | POA: Insufficient documentation

## 2015-03-23 DIAGNOSIS — Z23 Encounter for immunization: Secondary | ICD-10-CM | POA: Diagnosis not present

## 2015-03-23 DIAGNOSIS — K5900 Constipation, unspecified: Secondary | ICD-10-CM | POA: Diagnosis not present

## 2015-03-23 DIAGNOSIS — R609 Edema, unspecified: Secondary | ICD-10-CM

## 2015-03-23 DIAGNOSIS — S22000D Wedge compression fracture of unspecified thoracic vertebra, subsequent encounter for fracture with routine healing: Secondary | ICD-10-CM

## 2015-03-23 DIAGNOSIS — K5909 Other constipation: Secondary | ICD-10-CM | POA: Insufficient documentation

## 2015-03-23 DIAGNOSIS — M810 Age-related osteoporosis without current pathological fracture: Secondary | ICD-10-CM | POA: Diagnosis not present

## 2015-03-23 DIAGNOSIS — I10 Essential (primary) hypertension: Secondary | ICD-10-CM | POA: Diagnosis not present

## 2015-03-23 MED ORDER — LIDOCAINE 5 % EX PTCH: 1.0000 | MEDICATED_PATCH | CUTANEOUS | Status: DC

## 2015-03-23 MED ORDER — TETANUS-DIPHTH-ACELL PERTUSSIS 5-2.5-18.5 LF-MCG/0.5 IM SUSP: 0.5000 mL | Freq: Once | INTRAMUSCULAR | Status: DC

## 2015-03-23 MED ORDER — TRAMADOL HCL 50 MG PO TABS: 50.0000 mg | ORAL_TABLET | Freq: Three times a day (TID) | ORAL | Status: DC | PRN

## 2015-03-23 NOTE — Patient Instructions (Addendum)
Stop lotensin/hct Begin the metoprolol  twice daily  Try lidoderm patch on in the morning and off 12 hrs later  Tramadol  every 8 hrs as needed for severe pain only. Can try senna s 2 tabs daily if you get constipated from the tramadol. If no bm in 2-3 days, take miralax.

## 2015-03-23 NOTE — Progress Notes (Signed)
Patient ID: Alejandra Bailey, female   DOB: 1923/07/10, 81 y.o.   MRN: [id number]    Location:  [facility]  Code Status: DNR Goals of Care: Advanced Directive information Does patient have an advance directive?: Yes, Type of Advance Directive: Healthcare Power of [place];Living will;Out of facility DNR (pink MOST or yellow form), Pre-existing out of facility DNR order (yellow form or pink MOST form): Yellow form placed in chart (order not valid for inpatient use)   Chief Complaint  Patient presents with  . Follow-up    Follow-up from ER.  . Medical Management of Chronic Issues    HPI: Patient is a 81 y.o. female seen in the office today for an ED follow-up for new onset of mid back pain that felt like it was going through her and associated with shortness of breath.  We had referred her to the ED or urgent care due to fact that she sounded dyspneic on the phone.  She was found to have a thoracic compression fracture causing her midline acute thoracic pain on the CT angio chest/aorta w/ and w/o contrast.  Images were reviewed.    Had woken up with her pain in her back on Sat morning.  She thinks it is from using her walker, but her son noticed that she falls asleep in front of the TV.  Does not have a recliner and now she is sitting forward.  Tells me story where she fell backwards once more than three years ago.   No recent falls.    Is still having pain.  Was taking the hydrocodone every 6 hrs, but she's used up what they gave her.  The pill did help her.  Her pain was returning after 4 hrs though.  It did increase confusion and it made her sleepy.  Has been unsteady for a while--uses walker (rolling with skis).    She lives in independent living at this time at [place].  Feeds herself, eats, watches tv, goes to [employer].  Is pretty active.    Was started on metoprolol for her tachycardia at the ED.  Has been on lasix and benazepril/hctz and sodium  was low at ED.    In terms of compression fracture, she is already on vitamin D 2000 units daily.  Drinks a lot of milk.  Used to take boniva for many years and then it was stopped.  Would consider prolia.    Review of Systems:  Review of Systems  Constitutional: Negative for fever and chills.  HENT: Negative for congestion.   Respiratory: Negative for shortness of breath.   Cardiovascular: Positive for leg swelling. Negative for chest pain.  Gastrointestinal: Positive for constipation. Negative for abdominal pain, diarrhea, blood in stool and melena.  Musculoskeletal: Positive for back pain. Negative for falls.       Walks with rolling walker with skis  Skin: Negative for rash.  Neurological: Negative for dizziness, loss of consciousness and weakness.    Past Medical History  Diagnosis Date  . Hypertension   . Tremor   . High cholesterol   . History of pelvic fracture   . History of measles   . History of mumps   . A-fib   . Parkinson disease     Past Surgical History  Procedure Laterality Date  . Tonsillectomy  1934  . Fracture pelvic bone  2011    No Known Allergies Medications: Patient's Medications  New Prescriptions   No medications  on file  Previous Medications   ASPIRIN EC 325 MG TABLET    Take 1 tablet (325 mg total) by mouth daily.   BENAZEPRIL-HYDROCHLORTHIAZIDE (LOTENSIN HCT) 20-25 MG PER TABLET    Take one tablet by mouth once daily for blood pressure   CHOLECALCIFEROL (VITAMIN D-3 PO)    Take 2,000 Units by mouth daily.    FUROSEMIDE (LASIX) 20 MG TABLET    Take 1 and 1/2 tablet by mouth every morning for swelling of your legs.   HYDROCODONE-ACETAMINOPHEN (NORCO) 5-325 MG PER TABLET    Take 1 tablet by mouth every 6 (six) hours as needed.   METOPROLOL TARTRATE (LOPRESSOR) 25 MG TABLET    Take 1 tablet (25 mg total) by mouth 2 (two) times daily.   NAPROXEN SODIUM (ANAPROX) 220 MG TABLET    Take 220 mg by mouth 2 (two) times daily as needed (pain).    OMEGA-3 FATTY ACIDS (FISH OIL PO)    Take 1 capsule by mouth daily.   PRIMIDONE (MYSOLINE) 50 MG TABLET    Take two tablets by mouth once daily   TDAP (BOOSTRIX) 5-2.5-18.5 LF-MCG/0.5 INJECTION    Inject 0.5 mLs into the muscle once.  Modified Medications   No medications on file  Discontinued Medications   No medications on file    Physical Exam: Filed Vitals:   03/23/15 1329  BP: 104/68  Pulse: 91  Temp: 97.9 F (36.6 C)  TempSrc: Oral  Resp: 20  Height: 5' (1.524 m)  Weight: 111 lb (50.349 kg)  SpO2: 94%   Physical Exam  Constitutional: She is oriented to person, place, and time. No distress.  Thin white female   Cardiovascular: Normal rate, regular rhythm, normal heart sounds and intact distal pulses.   Bilateral tender 2+ edema  Pulmonary/Chest: Effort normal and breath sounds normal.  Musculoskeletal: Normal range of motion. She exhibits tenderness.  Over midthoracic spine at bra-line, also tender to the left side posteriorly  Neurological: She is alert and oriented to person, place, and time.  Skin:  Excoriations on anterior shins left greater than right    Labs reviewed: Basic Metabolic Panel:  Recent Labs  16/10/96 1028 03/21/15 1135  NA 133* 133*  K 4.3 3.9  CL 90* 98*  CO2 27 22  GLUCOSE 86 99  BUN 11 20  CREATININE 0.67 0.65  CALCIUM 10.1 9.8   Liver Function Tests:  Recent Labs  03/07/15 1028  AST 20  ALT 16  ALKPHOS 68  BILITOT 0.5  PROT 7.2   No results for input(s): LIPASE, AMYLASE in the last 8760 hours. No results for input(s): AMMONIA in the last 8760 hours. CBC:  Recent Labs  03/07/15 1028 03/21/15 1135  WBC 5.4 7.0  NEUTROABS 2.4  --   HGB  --  13.5  HCT 41.0 39.8  MCV  --  91.3  PLT  --  239   Lipid Panel: No results for input(s): CHOL, HDL, LDLCALC, TRIG, CHOLHDL, LDLDIRECT in the last 8760 hours. No results found for: HGBA1C  Procedures since last visit: CXR 03/21/15:  No active cardiopulmonary disease. CT angio  chest aorta w/ cm and w/o cm:  1. Negative for aortic dissection or acute pulmonary embolus. 2. Multiple thoracic spine compression fracture deformities, of uncertain chronicity. If symptoms referable to this area persist, elective outpatient MR may be useful for further characterization and treatment planning. 3. Atherosclerosis, including aortoiliac and coronary artery disease. Please note that although the presence of coronary artery  calcium documents the presence of coronary artery disease, the severity of this disease and any potential stenosis cannot be assessed on this non-gated CT examination. Assessment for potential risk factor modification, dietary therapy or pharmacologic therapy may be warranted, if clinically indicated. 4. Four-chamber cardiomegaly with findings of pulmonary Hypertension.  Assessment/Plan 1. Thoracic compression fracture, with routine healing, subsequent encounter -she and her family would like her off of hydrocodone though she did seem to tolerate it quite well -referred for home health PT, OT for exercises to help with posture and positioning during adls to decrease pain -may benefit from brace -also will try lidoderm patch for this pain -if she still has breakthrough pain, will use tramadol for severe pain up to 3x per day (also use senna s and miralax)  2. Senile osteoporosis -cont vitamin D 2000 units daily -has been on boniva for a long time in the past -would repeat bone density (not done recently) and try her on prolia  3. Edema -recommended resuming compression hose--family will help her get hose donner at the medical supply store  4. Chronic constipation -use senna s when on tramadol and take miralax if no bm in 2-3 days  5. Essential hypertension, benign -bp at goal today with her lotensin, but she had hyponatremia and hypokalemia in the ED  -will d/c lotensin and cont the metroprolol that was started in the ED due to her  tachycardia -monitor bp  6.  Need for tdap booster:  boostrix script will be printed  Labs/tests ordered: plan on bone density next visit Next appt:  Keep October appt  Winifred Balogh L. Jodel Mayhall, D.O. Geriatrics [facility] [address], [place] [address] Cell Phone (Mon-Fri 8am-5pm):  [phone] On Call:  [phone] & follow prompts after 5pm & weekends Office Phone:  [phone] Office Fax:  [phone]

## 2015-03-24 ENCOUNTER — Encounter (HOSPITAL_COMMUNITY): Payer: Self-pay | Admitting: Emergency Medicine

## 2015-03-24 ENCOUNTER — Emergency Department (HOSPITAL_COMMUNITY): Payer: Commercial Managed Care - HMO

## 2015-03-24 ENCOUNTER — Emergency Department (HOSPITAL_COMMUNITY)
Admission: EM | Admit: 2015-03-24 | Discharge: 2015-03-25 | Disposition: A | Discharge status: Home / Self Care | Payer: Commercial Managed Care - HMO | Attending: Emergency Medicine | Admitting: Emergency Medicine

## 2015-03-24 DIAGNOSIS — Z79899 Other long term (current) drug therapy: Secondary | ICD-10-CM | POA: Insufficient documentation

## 2015-03-24 DIAGNOSIS — I10 Essential (primary) hypertension: Secondary | ICD-10-CM | POA: Diagnosis not present

## 2015-03-24 DIAGNOSIS — IMO0002 Reserved for concepts with insufficient information to code with codable children: Secondary | ICD-10-CM

## 2015-03-24 DIAGNOSIS — I4891 Unspecified atrial fibrillation: Secondary | ICD-10-CM | POA: Insufficient documentation

## 2015-03-24 DIAGNOSIS — M8448XA Pathological fracture, other site, initial encounter for fracture: Secondary | ICD-10-CM | POA: Diagnosis not present

## 2015-03-24 DIAGNOSIS — G20 Parkinson's disease: Secondary | ICD-10-CM | POA: Diagnosis not present

## 2015-03-24 DIAGNOSIS — M549 Dorsalgia, unspecified: Secondary | ICD-10-CM

## 2015-03-24 DIAGNOSIS — Z8619 Personal history of other infectious and parasitic diseases: Secondary | ICD-10-CM | POA: Diagnosis not present

## 2015-03-24 DIAGNOSIS — M545 Low back pain: Secondary | ICD-10-CM | POA: Diagnosis present

## 2015-03-24 DIAGNOSIS — Z8639 Personal history of other endocrine, nutritional and metabolic disease: Secondary | ICD-10-CM | POA: Diagnosis not present

## 2015-03-24 DIAGNOSIS — Z7982 Long term (current) use of aspirin: Secondary | ICD-10-CM | POA: Insufficient documentation

## 2015-03-24 DIAGNOSIS — K5900 Constipation, unspecified: Secondary | ICD-10-CM | POA: Diagnosis not present

## 2015-03-24 DIAGNOSIS — R509 Fever, unspecified: Secondary | ICD-10-CM

## 2015-03-24 LAB — CBC WITH DIFFERENTIAL/PLATELET
Basophils Absolute: 0.1 10*3/uL (ref 0.0–0.1)
Basophils Relative: 1 % (ref 0–1)
Eosinophils Absolute: 0.1 10*3/uL (ref 0.0–0.7)
Eosinophils Relative: 1 % (ref 0–5)
HEMATOCRIT: 39.5 % (ref 36.0–46.0)
Hemoglobin: 13.3 g/dL (ref 12.0–15.0)
Lymphocytes Relative: 27 % (ref 12–46)
Lymphs Abs: 1.7 10*3/uL (ref 0.7–4.0)
MCH: 30.4 pg (ref 26.0–34.0)
MCHC: 33.7 g/dL (ref 30.0–36.0)
MCV: 90.2 fL (ref 78.0–100.0)
MONO ABS: 0.7 10*3/uL (ref 0.1–1.0)
MONOS PCT: 11 % (ref 3–12)
NEUTROS ABS: 3.7 10*3/uL (ref 1.7–7.7)
Neutrophils Relative %: 60 % (ref 43–77)
Platelets: 257 10*3/uL (ref 150–400)
RBC: 4.38 MIL/uL (ref 3.87–5.11)
RDW: 14 % (ref 11.5–15.5)
WBC: 6.2 10*3/uL (ref 4.0–10.5)

## 2015-03-24 LAB — BASIC METABOLIC PANEL
Anion gap: 8 (ref 5–15)
BUN: 19 mg/dL (ref 6–20)
CHLORIDE: 98 mmol/L — AB (ref 101–111)
CO2: 31 mmol/L (ref 22–32)
Calcium: 9.8 mg/dL (ref 8.9–10.3)
Creatinine, Ser: 0.66 mg/dL (ref 0.44–1.00)
GFR calc Af Amer: >60 mL/min (ref >60)
Glucose, Bld: 105 mg/dL — ABNORMAL HIGH (ref 65–99)
POTASSIUM: 3.1 mmol/L — AB (ref 3.5–5.1)
Sodium: 137 mmol/L (ref 135–145)

## 2015-03-24 LAB — URINE MICROSCOPIC-ADD ON

## 2015-03-24 LAB — URINALYSIS, ROUTINE W REFLEX MICROSCOPIC
Bilirubin Urine: NEGATIVE
Glucose, UA: NEGATIVE mg/dL
Hgb urine dipstick: NEGATIVE
Ketones, ur: 15 mg/dL — AB
Nitrite: NEGATIVE
Protein, ur: NEGATIVE mg/dL
Specific Gravity, Urine: 1.021 (ref 1.005–1.030)
UROBILINOGEN UA: 1 mg/dL (ref 0.0–1.0)
pH: 6.5 (ref 5.0–8.0)

## 2015-03-24 MED ORDER — SODIUM CHLORIDE 0.9 % IV BOLUS (SEPSIS)
500.0000 mL | Freq: Once | INTRAVENOUS | Status: AC
  Administered 2015-03-24: 500 mL via INTRAVENOUS

## 2015-03-24 MED ORDER — GADOBENATE DIMEGLUMINE 529 MG/ML IV SOLN
10.0000 mL | Freq: Once | INTRAVENOUS | Status: AC | PRN
  Administered 2015-03-24: 10 mL via INTRAVENOUS

## 2015-03-24 MED ORDER — POTASSIUM CHLORIDE CRYS ER 20 MEQ PO TBCR
40.0000 meq | EXTENDED_RELEASE_TABLET | Freq: Once | ORAL | Status: AC
  Administered 2015-03-25: 40 meq via ORAL
  Filled 2015-03-24: qty 2

## 2015-03-24 NOTE — ED Notes (Signed)
Alejandra Bailey- son-  [phone]

## 2015-03-24 NOTE — ED Provider Notes (Signed)
CSN: [id number]     Arrival date & time 03/24/15  2020 History   First MD Initiated Contact with Patient 03/24/15 2024        (Consider location/radiation/quality/duration/timing/severity/associated sxs/prior Treatment) The history is provided by the patient, medical records and the EMS personnel. No language interpreter was used.  Patient is a 81 y.o. female with past medical history of hypertension, tremor, atrial fibrillation, compression fractions of the lower thoracic spine who presents today with worsening right lower sided back pain. Patient states that this is been ongoing for some time now she most recently was seen a week ago here in the emergency department with a similar complaint was found to have compression fractures of T7 -T 11 that I worsened from prior imaging. Patient relates that her back pain is similar to this except it seems to severely worsened since this morning. She has any nausea, vomiting, diarrhea, chest pain, shortness of breath.   She states that she's not had any numbness or tingling no loss of bowel or bladder control and no perianal numbness and tingling. She denies any known fevers but EMS does relate she had a temperature of 100 Fahrenheit in route. She did notably receive fentanyl by EMS which seemed to improve her pain. Patient relates that moving around makes the pain in her back worse and lying still seems to make her pain better. She states that the pain does not radiate anywhere.   Patient did notably have a CT scan of her chest back on 03/21/2015 this was to rule out dissection which was notably negative.   Past Medical History  Diagnosis Date  . Hypertension   . Tremor   . High cholesterol   . History of pelvic fracture   . History of measles   . History of mumps   . A-fib   . Parkinson disease    Past Surgical History  Procedure Laterality Date  . Tonsillectomy  1934  . Fracture pelvic bone  2011   Family History  Problem Relation Age of  Onset  . Stroke Father   . Breast cancer Mother   . Gout Son   . Heart Problems Son     Triple Bypass  . Cancer Maternal Grandmother 69    breast cancer  . Cancer Maternal Grandfather 50    stomach cancer   History  Substance Use Topics  . Smoking status: Never Smoker   . Smokeless tobacco: Never Used  . Alcohol Use: No   OB History    No data available     Review of Systems  Constitutional: Negative for fever (although EMS relates fever 100F in route) and chills.  HENT: Negative for congestion and rhinorrhea.   Eyes: Negative for photophobia and visual disturbance.  Respiratory: Negative for shortness of breath and wheezing.   Cardiovascular: Negative for palpitations.  Gastrointestinal: Positive for constipation (pt relates this is long term problem). Negative for nausea, vomiting and diarrhea.  Genitourinary: Negative for decreased urine volume and difficulty urinating.  Musculoskeletal: Positive for back pain (as described in HPI) and gait problem (at baseline, uses walker). Negative for neck pain and neck stiffness.  Skin: Negative for rash and wound.  Neurological: Negative for light-headedness, numbness and headaches.  Psychiatric/Behavioral: Negative for confusion and agitation.  All other systems reviewed and are negative.     Allergies  Review of patient's allergies indicates no known allergies.  Home Medications   Prior to Admission medications   Medication Sig Start Date End  Date Taking? Authorizing Provider  aspirin EC 325 MG tablet Take 1 tablet (325 mg total) by mouth daily. 11/03/13  Yes Mahima Glade Lloyd, MD  Cholecalciferol (VITAMIN D-3 PO) Take 2,000 Units by mouth daily.    Yes Historical Provider, MD  furosemide (LASIX) 20 MG tablet Take 1 and 1/2 tablet by mouth every morning for swelling of your legs. Patient taking differently: Take 30 mg by mouth daily. Take 1 and 1/2 tablet by mouth every morning for swelling of your legs. 01/19/15  Yes Sharon Seller, NP  metoprolol tartrate (LOPRESSOR) 25 MG tablet Take 1 tablet (25 mg total) by mouth 2 (two) times daily. 03/21/15  Yes Blane Ohara, MD  primidone (MYSOLINE) 50 MG tablet Take two tablets by mouth once daily 01/19/15  Yes Sharon Seller, NP  traMADol (ULTRAM) 50 MG tablet Take 1 tablet (50 mg total) by mouth every 8 (eight) hours as needed. Patient taking differently: Take 50 mg by mouth every 8 (eight) hours as needed for moderate pain or severe pain.  03/23/15  Yes Tiffany L Reed, DO  HYDROcodone-acetaminophen (NORCO) 5-325 MG per tablet Take 1 tablet by mouth every 6 (six) hours as needed for moderate pain or severe pain. 03/25/15   Madolyn Frieze, MD  lidocaine (LIDODERM) 5 % Place 1 patch onto the skin daily. Remove & Discard patch within 12 hours or as directed by MD 03/23/15   Kermit Balo, DO  Tdap (BOOSTRIX) 5-2.5-18.5 LF-MCG/0.5 injection Inject 0.5 mLs into the muscle once. 03/23/15   Tiffany L Reed, DO   BP 164/103 mmHg  Pulse 98  Temp(Src) 98.2 F (36.8 C) (Oral)  Resp 20  SpO2 98% Physical Exam  Constitutional: She is oriented to person, place, and time. No distress.  Elderly and frail appearing  HENT:  Head: Normocephalic and atraumatic.  Eyes: Conjunctivae and EOM are normal.  Neck: Normal range of motion. Neck supple.  Cardiovascular: Normal rate and regular rhythm.   Pulmonary/Chest: Effort normal and breath sounds normal. No respiratory distress. She has no wheezes.  Abdominal: Soft. She exhibits no distension. There is no tenderness. There is no rebound and no guarding.  Genitourinary: Rectal exam shows anal tone normal.  Musculoskeletal:       Thoracic back: She exhibits no bony tenderness (no bony tenderness to palpation) and no swelling.  Neurological: She is alert and oriented to person, place, and time. She has normal strength. No cranial nerve deficit (cn ii-xii intact) or sensory deficit. GCS eye subscore is 4. GCS verbal subscore is 5. GCS motor  subscore is 6.  Skin: Skin is warm and dry. She is not diaphoretic.  Nursing note and vitals reviewed.   ED Course  Procedures (including critical care time) Labs Review Labs Reviewed  URINALYSIS, ROUTINE W REFLEX MICROSCOPIC (NOT AT [facility]) - Abnormal; Notable for the following:    APPearance CLOUDY (*)    Ketones, ur 15 (*)    Leukocytes, UA SMALL (*)    All other components within normal limits  BASIC METABOLIC PANEL - Abnormal; Notable for the following:    Potassium 3.1 (*)    Chloride 98 (*)    Glucose, Bld 105 (*)    All other components within normal limits  CBC WITH DIFFERENTIAL/PLATELET  URINE MICROSCOPIC-ADD ON    Imaging Review Mr Thoracic Spine W Wo Contrast  03/24/2015   CLINICAL DATA:  RIGHT-sided back pain for 1 week, worsening today. No recent fall. History of atrial fibrillation, Parkinson's disease,  hypertension and tremor.  EXAM: MRI THORACIC AND LUMBAR SPINE WITHOUT AND WITH CONTRAST  TECHNIQUE: Multiplanar and multiecho pulse sequences of the thoracic and lumbar spine were obtained without and with intravenous contrast.  CONTRAST:  10mL MULTIHANCE GADOBENATE DIMEGLUMINE 529 MG/ML IV SOLN  COMPARISON:  CT of the chest, abdomen and pelvis March 21, 2015  FINDINGS: MR THORACIC SPINE FINDINGS  Severe T6 burst fracture (At least 75% height loss) with low T1, bright T2 signal and enhancement consistent with acute injury, 3 mm retropulsed bony fragments. Mild T3, moderate to severe T7, severe T8, moderate T10, severe T11 (vertebra plana) compression fractures are chronic. Two subcentimeter low T1, bright STIR enhancing lesions in the RIGHT T5 vertebral body. Generalized bright T1 bone marrow signal. Multi level mid to lower thoracic moderate chronic discogenic endplate changes. Maintenance of thoracic kyphosis. No malalignment. T2 bright signal within the T1-2 disc associated with mild enhancement likely reflects acute edema. Decreased T2 signal within the thoracic discs  consistent with desiccation.  Thoracic spinal cord appears normal in morphology and signal characteristics to the level of the conus medullaris which terminates at T12-L1. No abnormal cord, leptomeningeal nor epidural enhancement. Mild enhancement , bright T2 signal within the RIGHT paraspinal soft tissues at the level the fracture (T5 through T9). Moderate symmetric paraspinal muscle atrophy.  Mild canal stenosis at C5-6. Mild canal stenosis T10-11 associated with old T11 burst fracture. No definite neural foraminal narrowing though limited by scoliosis.  MR LUMBAR SPINE FINDINGS  Lumbar vertebral bodies are intact. Grade 1 L4-5 anterolisthesis, grade 2 L5-S1 anterolisthesis without spondylolysis. Broad thoracolumbar dextroscoliosis, compensatory lower lumbar levoscoliosis on this nonweightbearing examination. Multilevel mild to moderate disc height loss, decreased T2 signal within all disc consistent with mild desiccation, superimposed mild edema within the L5-S1 disc associated with mild acute discogenic endplate changes. No suspicious osseous or intradiscal enhancement.  Conus medullaris terminates at T12-L1 appears normal morphology and signal characteristics. Cauda equina is normal. No abnormal cord, leptomeningeal nor epidural enhancement. Asymmetric severe RIGHT, moderate LEFT paraspinal muscle atrophy.  Level by level evaluation:  L1-2: Small broad-based disc bulge, mild facet arthropathy and ligamentum flavum redundancy without canal stenosis or neural foraminal narrowing.  L2-3: Small broad-based disc bulge. Moderate facet arthropathy and ligamentum flavum redundancy. Mild canal stenosis, mild neural foraminal narrowing.  L3-4: Small broad-based disc bulge. Moderate facet arthropathy and ligamentum flavum redundancy without canal stenosis. Mild RIGHT neural foraminal narrowing.  L4-5: Anterolisthesis. Moderate to severe RIGHT, mild LEFT facet arthropathy. Annular bulging asymmetric to the RIGHT. No canal  stenosis. Mild to moderate RIGHT neural foraminal narrowing.  L5-S1: Anterolisthesis. Small broad-based disc bulge. Severe bilateral facet arthropathy with mild enhancing facets and effusion consistent with the reactive changes. No canal stenosis. Severe RIGHT, moderate LEFT neural foraminal narrowing.  IMPRESSION: MRI THORACIC SPINE: Acute to subacute severe T6 burst fracture, 3 mm retropulsed bony fragments resulting in mild canal stenosis. Mild enhancement within the RIGHT paraspinal soft tissues likely represents subacute hematoma.  Enhancing lesions in RIGHT T5 vertebral body, it is unclear if these represent atypical acute fracture, an atypical hemangioma or less likely metastasis.  Osteopenia. Mild T3, moderate to severe T7, severe T8, moderate T10, severe T11 (vertebra plana) chronic compression/burst fractures.  MRI LUMBAR SPINE:  No acute fracture or malalignment.  Grade 1 L4-5 anterolisthesis, grade 2 L5-S1 anterolisthesis on degenerative basis.  Mild canal stenosis L2-3. Neural foraminal narrowing L2-3 through L5-S1: Severe on the RIGHT at L5-S1.  Severe L5-S1 facet arthropathy with acute  reactive changes.   Electronically Signed   By: Awilda Metro M.D.   On: 03/24/2015 23:47   Mr Lumbar Spine W Wo Contrast  03/24/2015   CLINICAL DATA:  RIGHT-sided back pain for 1 week, worsening today. No recent fall. History of atrial fibrillation, Parkinson's disease, hypertension and tremor.  EXAM: MRI THORACIC AND LUMBAR SPINE WITHOUT AND WITH CONTRAST  TECHNIQUE: Multiplanar and multiecho pulse sequences of the thoracic and lumbar spine were obtained without and with intravenous contrast.  CONTRAST:  10mL MULTIHANCE GADOBENATE DIMEGLUMINE 529 MG/ML IV SOLN  COMPARISON:  CT of the chest, abdomen and pelvis March 21, 2015  FINDINGS: MR THORACIC SPINE FINDINGS  Severe T6 burst fracture (At least 75% height loss) with low T1, bright T2 signal and enhancement consistent with acute injury, 3 mm retropulsed bony  fragments. Mild T3, moderate to severe T7, severe T8, moderate T10, severe T11 (vertebra plana) compression fractures are chronic. Two subcentimeter low T1, bright STIR enhancing lesions in the RIGHT T5 vertebral body. Generalized bright T1 bone marrow signal. Multi level mid to lower thoracic moderate chronic discogenic endplate changes. Maintenance of thoracic kyphosis. No malalignment. T2 bright signal within the T1-2 disc associated with mild enhancement likely reflects acute edema. Decreased T2 signal within the thoracic discs consistent with desiccation.  Thoracic spinal cord appears normal in morphology and signal characteristics to the level of the conus medullaris which terminates at T12-L1. No abnormal cord, leptomeningeal nor epidural enhancement. Mild enhancement , bright T2 signal within the RIGHT paraspinal soft tissues at the level the fracture (T5 through T9). Moderate symmetric paraspinal muscle atrophy.  Mild canal stenosis at C5-6. Mild canal stenosis T10-11 associated with old T11 burst fracture. No definite neural foraminal narrowing though limited by scoliosis.  MR LUMBAR SPINE FINDINGS  Lumbar vertebral bodies are intact. Grade 1 L4-5 anterolisthesis, grade 2 L5-S1 anterolisthesis without spondylolysis. Broad thoracolumbar dextroscoliosis, compensatory lower lumbar levoscoliosis on this nonweightbearing examination. Multilevel mild to moderate disc height loss, decreased T2 signal within all disc consistent with mild desiccation, superimposed mild edema within the L5-S1 disc associated with mild acute discogenic endplate changes. No suspicious osseous or intradiscal enhancement.  Conus medullaris terminates at T12-L1 appears normal morphology and signal characteristics. Cauda equina is normal. No abnormal cord, leptomeningeal nor epidural enhancement. Asymmetric severe RIGHT, moderate LEFT paraspinal muscle atrophy.  Level by level evaluation:  L1-2: Small broad-based disc bulge, mild facet  arthropathy and ligamentum flavum redundancy without canal stenosis or neural foraminal narrowing.  L2-3: Small broad-based disc bulge. Moderate facet arthropathy and ligamentum flavum redundancy. Mild canal stenosis, mild neural foraminal narrowing.  L3-4: Small broad-based disc bulge. Moderate facet arthropathy and ligamentum flavum redundancy without canal stenosis. Mild RIGHT neural foraminal narrowing.  L4-5: Anterolisthesis. Moderate to severe RIGHT, mild LEFT facet arthropathy. Annular bulging asymmetric to the RIGHT. No canal stenosis. Mild to moderate RIGHT neural foraminal narrowing.  L5-S1: Anterolisthesis. Small broad-based disc bulge. Severe bilateral facet arthropathy with mild enhancing facets and effusion consistent with the reactive changes. No canal stenosis. Severe RIGHT, moderate LEFT neural foraminal narrowing.  IMPRESSION: MRI THORACIC SPINE: Acute to subacute severe T6 burst fracture, 3 mm retropulsed bony fragments resulting in mild canal stenosis. Mild enhancement within the RIGHT paraspinal soft tissues likely represents subacute hematoma.  Enhancing lesions in RIGHT T5 vertebral body, it is unclear if these represent atypical acute fracture, an atypical hemangioma or less likely metastasis.  Osteopenia. Mild T3, moderate to severe T7, severe T8, moderate T10, severe T11 (vertebra  plana) chronic compression/burst fractures.  MRI LUMBAR SPINE:  No acute fracture or malalignment.  Grade 1 L4-5 anterolisthesis, grade 2 L5-S1 anterolisthesis on degenerative basis.  Mild canal stenosis L2-3. Neural foraminal narrowing L2-3 through L5-S1: Severe on the RIGHT at L5-S1.  Severe L5-S1 facet arthropathy with acute reactive changes.   Electronically Signed   By: Awilda Metro M.D.   On: 03/24/2015 23:47     EKG Interpretation   Date/Time:  Friday March 24 2015 20:34:12 EDT Ventricular Rate:  94 PR Interval:    QRS Duration: 94 QT Interval:  317 QTC Calculation: 396 R Axis:   24 Text  Interpretation:  Atrial fibrillation RSR' in V1 or V2, probably  normal variant Nonspecific repol abnormality, diffuse leads Since last  tracing rate slower Abnormal ekg Confirmed by Hyacinth Meeker  MD, BRIAN ([id number]) on  03/24/2015 8:57:55 PM      MDM   Final diagnoses:  Back pain  Fever  Compression fracture    Patient is a 81 y.o. female with past medical history of hypertension, tremor, atrial fibrillation, compression fractions of the lower thoracic spine who presents today with worsening right lower sided back pain.   Patient is awake, alert and oriented on exam. Afebrile here. Differential includes epidural abscess, hematoma, worsening compression fracture, pyelonephritis, musculoskeletal etiology. Given worsening symptoms with related fever by EMS, obtained MRI wwo T/L spine. Additionally obtained urine samples to rule out urinary source.   Back pain: Notably shows acute/subacute compression fractures of the thoracic spine. No evidence epidural abscess or other infectious etiology. Compared with prior imaging, this appears to be consistent with recent plain films showing compression fractures in the mid to low T spine. No acute neurological deficits were appreciated on my exam. Additionally, urine does not appear consistent with UTI.  Following MRI, we were able to get walker to ambulate patient and she as able to ambulate the hallway. Discussed plan for discharge home with short course of pain meds and follow-up with PCP early next week with patient's daughter-in-law (over phone). She is agreeable with this. Pt states that someone will be able to get her back into her apartment. Will arrange transport back to assisted living facility. Did notably try to get in touch with social work (per family request) to discuss patient's current living arrangement but they were not available at this time.  Pt stable at time of discharge from the emergency department.     Madolyn Frieze, MD 03/25/15  [id number]  Eber Hong, MD 03/25/15 [phone]

## 2015-03-24 NOTE — ED Provider Notes (Signed)
The patient is a 81 year old female who complains of back pain. This is mid thoracic, it is reproducible on my exam with pushing over this thoracic spine, she is able to move both legs without difficulty, both arms without difficulty, no abdominal tenderness and clear heart and lung sounds. We'll obtain MRI imaging of her thoracic and lumbar spines to rule out infections, epidural abscesses, discitis, osteomyelitis or other problems. She has recently had a CT scan of the abdomen and pelvis and chest for dissection which did not show any signs of significant pathology. The patient is in agreement with the plan.  I saw and evaluated the patient, reviewed the resident's note and I agree with the findings and plan.   Final diagnoses:  Back pain  Fever  Compression fracture      Eber Hong, MD 03/25/15 [phone]

## 2015-03-24 NOTE — ED Notes (Signed)
Per EMS, pt stated that she has had right sided back pain for the past week. Pt stated that her pain got worse today, and the facility that she lives at called 911.

## 2015-03-25 MED ORDER — OXYCODONE-ACETAMINOPHEN 5-325 MG PO TABS
1.0000 | ORAL_TABLET | Freq: Once | ORAL | Status: AC
  Administered 2015-03-25: 1 via ORAL
  Filled 2015-03-25: qty 1

## 2015-03-25 MED ORDER — HYDROCODONE-ACETAMINOPHEN 5-325 MG PO TABS: 1.0000 | ORAL_TABLET | Freq: Four times a day (QID) | ORAL | Status: DC | PRN

## 2015-03-25 NOTE — ED Notes (Signed)
[facility] called for d/c transportation

## 2015-03-25 NOTE — ED Notes (Signed)
Family requested social work consult to discuss options for increased level of care for pt in home.

## 2015-03-25 NOTE — Discharge Instructions (Signed)
Please follow-up with your physician early next week for re-evaluation of your back pain.

## 2015-03-27 ENCOUNTER — Encounter: Payer: Self-pay | Admitting: Internal Medicine

## 2015-03-28 ENCOUNTER — Telehealth: Payer: Self-pay | Admitting: *Deleted

## 2015-03-28 NOTE — Telephone Encounter (Signed)
I called the pharmacy and left message (do not open until 9:00 am) for them to send [place] a fax if we need to initiate PA (prior authorization)

## 2015-03-28 NOTE — Telephone Encounter (Signed)
Received Prior Authorization from [employer] #: [id number] for patient's Lidocaine 5%. Started that process through Cover My Meds. Will receive determination in 24-72 hours.  [employer]#: [phone] Member ID: [id number]

## 2015-03-30 NOTE — Telephone Encounter (Signed)
Determination received from [facility] ([place] and was Denied. Given to Dr. Renato Gails to review.

## 2015-03-31 ENCOUNTER — Encounter: Payer: Self-pay | Admitting: Internal Medicine

## 2015-03-31 NOTE — Telephone Encounter (Signed)
Patient Notified and stated that she was going to call insurance company

## 2015-04-03 ENCOUNTER — Other Ambulatory Visit: Payer: Self-pay | Admitting: *Deleted

## 2015-04-03 ENCOUNTER — Encounter: Payer: Self-pay | Admitting: Internal Medicine

## 2015-04-03 DIAGNOSIS — M6281 Muscle weakness (generalized): Secondary | ICD-10-CM | POA: Diagnosis not present

## 2015-04-03 DIAGNOSIS — M810 Age-related osteoporosis without current pathological fracture: Secondary | ICD-10-CM | POA: Diagnosis not present

## 2015-04-03 DIAGNOSIS — R269 Unspecified abnormalities of gait and mobility: Secondary | ICD-10-CM | POA: Diagnosis not present

## 2015-04-03 DIAGNOSIS — M545 Low back pain: Secondary | ICD-10-CM | POA: Diagnosis not present

## 2015-04-03 DIAGNOSIS — I10 Essential (primary) hypertension: Secondary | ICD-10-CM | POA: Diagnosis not present

## 2015-04-03 MED ORDER — LIDOCAINE-TETRACAINE 70-70 MG EX PTCH: MEDICATED_PATCH | CUTANEOUS | Status: DC

## 2015-04-03 MED ORDER — BUPRENORPHINE 5 MCG/HR TD PTWK: 5.0000 ug | MEDICATED_PATCH | TRANSDERMAL | Status: DC

## 2015-04-03 NOTE — Telephone Encounter (Signed)
Patient's insurance company denied Lidocaine medication, but gave a alternative medication of Synera which Dr. Renato Gails okay for patient to use. Called to inform the daughter -in-law that the medication had been sent to the pharmacy. The daughter-in-law stated that she would let us know how it works for her.

## 2015-04-21 ENCOUNTER — Emergency Department (HOSPITAL_COMMUNITY): Payer: Commercial Managed Care - HMO

## 2015-04-21 ENCOUNTER — Encounter (HOSPITAL_COMMUNITY): Payer: Self-pay | Admitting: *Deleted

## 2015-04-21 ENCOUNTER — Telehealth: Payer: Self-pay

## 2015-04-21 ENCOUNTER — Inpatient Hospital Stay (HOSPITAL_COMMUNITY)
Admission: EM | Admit: 2015-04-21 | Discharge: 2015-04-24 | DRG: 603 | Disposition: A | Discharge status: Home Health | Payer: Commercial Managed Care - HMO | Attending: Internal Medicine | Admitting: Internal Medicine

## 2015-04-21 DIAGNOSIS — Z803 Family history of malignant neoplasm of breast: Secondary | ICD-10-CM

## 2015-04-21 DIAGNOSIS — L8990 Pressure ulcer of unspecified site, unspecified stage: Secondary | ICD-10-CM | POA: Insufficient documentation

## 2015-04-21 DIAGNOSIS — I482 Chronic atrial fibrillation, unspecified: Secondary | ICD-10-CM | POA: Insufficient documentation

## 2015-04-21 DIAGNOSIS — I10 Essential (primary) hypertension: Secondary | ICD-10-CM | POA: Diagnosis not present

## 2015-04-21 DIAGNOSIS — L0390 Cellulitis, unspecified: Secondary | ICD-10-CM | POA: Diagnosis not present

## 2015-04-21 DIAGNOSIS — G20 Parkinson's disease: Secondary | ICD-10-CM | POA: Diagnosis not present

## 2015-04-21 DIAGNOSIS — I4891 Unspecified atrial fibrillation: Secondary | ICD-10-CM | POA: Diagnosis present

## 2015-04-21 DIAGNOSIS — L89899 Pressure ulcer of other site, unspecified stage: Secondary | ICD-10-CM | POA: Diagnosis not present

## 2015-04-21 DIAGNOSIS — G8929 Other chronic pain: Secondary | ICD-10-CM | POA: Diagnosis not present

## 2015-04-21 DIAGNOSIS — E785 Hyperlipidemia, unspecified: Secondary | ICD-10-CM | POA: Diagnosis present

## 2015-04-21 DIAGNOSIS — D649 Anemia, unspecified: Secondary | ICD-10-CM | POA: Diagnosis not present

## 2015-04-21 DIAGNOSIS — M7989 Other specified soft tissue disorders: Secondary | ICD-10-CM | POA: Diagnosis not present

## 2015-04-21 DIAGNOSIS — Z79891 Long term (current) use of opiate analgesic: Secondary | ICD-10-CM

## 2015-04-21 DIAGNOSIS — M545 Low back pain: Secondary | ICD-10-CM | POA: Diagnosis not present

## 2015-04-21 DIAGNOSIS — S22000D Wedge compression fracture of unspecified thoracic vertebra, subsequent encounter for fracture with routine healing: Secondary | ICD-10-CM

## 2015-04-21 DIAGNOSIS — L03116 Cellulitis of left lower limb: Principal | ICD-10-CM | POA: Diagnosis present

## 2015-04-21 DIAGNOSIS — Z66 Do not resuscitate: Secondary | ICD-10-CM | POA: Diagnosis present

## 2015-04-21 DIAGNOSIS — E780 Pure hypercholesterolemia: Secondary | ICD-10-CM | POA: Diagnosis present

## 2015-04-21 DIAGNOSIS — E876 Hypokalemia: Secondary | ICD-10-CM | POA: Diagnosis present

## 2015-04-21 DIAGNOSIS — Z79899 Other long term (current) drug therapy: Secondary | ICD-10-CM

## 2015-04-21 DIAGNOSIS — H9190 Unspecified hearing loss, unspecified ear: Secondary | ICD-10-CM | POA: Diagnosis not present

## 2015-04-21 DIAGNOSIS — Z7982 Long term (current) use of aspirin: Secondary | ICD-10-CM | POA: Diagnosis not present

## 2015-04-21 DIAGNOSIS — Z800 Family history of malignant neoplasm of digestive organs: Secondary | ICD-10-CM | POA: Diagnosis not present

## 2015-04-21 DIAGNOSIS — Z8619 Personal history of other infectious and parasitic diseases: Secondary | ICD-10-CM

## 2015-04-21 DIAGNOSIS — Z823 Family history of stroke: Secondary | ICD-10-CM | POA: Diagnosis not present

## 2015-04-21 DIAGNOSIS — M79609 Pain in unspecified limb: Secondary | ICD-10-CM | POA: Diagnosis not present

## 2015-04-21 LAB — CBC WITH DIFFERENTIAL/PLATELET
BASOS ABS: 0 10*3/uL (ref 0.0–0.1)
Basophils Relative: 1 % (ref 0–1)
EOS ABS: 0.1 10*3/uL (ref 0.0–0.7)
Eosinophils Relative: 2 % (ref 0–5)
HCT: 38.7 % (ref 36.0–46.0)
HEMOGLOBIN: 12.9 g/dL (ref 12.0–15.0)
LYMPHS PCT: 40 % (ref 12–46)
Lymphs Abs: 2.4 10*3/uL (ref 0.7–4.0)
MCH: 30.6 pg (ref 26.0–34.0)
MCHC: 33.3 g/dL (ref 30.0–36.0)
MCV: 91.9 fL (ref 78.0–100.0)
Monocytes Absolute: 0.5 10*3/uL (ref 0.1–1.0)
Monocytes Relative: 9 % (ref 3–12)
NEUTROS PCT: 48 % (ref 43–77)
Neutro Abs: 3 10*3/uL (ref 1.7–7.7)
Platelets: 217 10*3/uL (ref 150–400)
RBC: 4.21 MIL/uL (ref 3.87–5.11)
RDW: 14.5 % (ref 11.5–15.5)
WBC: 6 10*3/uL (ref 4.0–10.5)

## 2015-04-21 LAB — BASIC METABOLIC PANEL
ANION GAP: 6 (ref 5–15)
BUN: 16 mg/dL (ref 6–20)
CO2: 30 mmol/L (ref 22–32)
Calcium: 9.5 mg/dL (ref 8.9–10.3)
Chloride: 96 mmol/L — ABNORMAL LOW (ref 101–111)
Creatinine, Ser: 0.62 mg/dL (ref 0.44–1.00)
GFR calc Af Amer: >60 mL/min (ref >60)
Glucose, Bld: 126 mg/dL — ABNORMAL HIGH (ref 65–99)
Potassium: 3.3 mmol/L — ABNORMAL LOW (ref 3.5–5.1)
Sodium: 132 mmol/L — ABNORMAL LOW (ref 135–145)

## 2015-04-21 LAB — MAGNESIUM: MAGNESIUM: 1.8 mg/dL (ref 1.7–2.4)

## 2015-04-21 MED ORDER — ONDANSETRON HCL 4 MG PO TABS: 4.0000 mg | ORAL_TABLET | Freq: Four times a day (QID) | ORAL | Status: DC | PRN

## 2015-04-21 MED ORDER — ASPIRIN EC 325 MG PO TBEC
325.0000 mg | DELAYED_RELEASE_TABLET | Freq: Every day | ORAL | Status: DC
  Administered 2015-04-22 – 2015-04-24 (×3): 325 mg via ORAL
  Filled 2015-04-21 (×3): qty 1

## 2015-04-21 MED ORDER — HYDROCODONE-ACETAMINOPHEN 5-325 MG PO TABS: 1.0000 | ORAL_TABLET | Freq: Four times a day (QID) | ORAL | Status: DC | PRN

## 2015-04-21 MED ORDER — TRAMADOL HCL 50 MG PO TABS: 50.0000 mg | ORAL_TABLET | Freq: Three times a day (TID) | ORAL | Status: DC | PRN

## 2015-04-21 MED ORDER — METOPROLOL TARTRATE 25 MG PO TABS
12.5000 mg | ORAL_TABLET | Freq: Two times a day (BID) | ORAL | Status: DC
  Administered 2015-04-22 – 2015-04-24 (×5): 12.5 mg via ORAL
  Filled 2015-04-21 (×6): qty 1

## 2015-04-21 MED ORDER — ENOXAPARIN SODIUM 40 MG/0.4ML ~~LOC~~ SOLN
40.0000 mg | Freq: Every day | SUBCUTANEOUS | Status: DC
  Administered 2015-04-21 – 2015-04-23 (×3): 40 mg via SUBCUTANEOUS
  Filled 2015-04-21 (×3): qty 0.4

## 2015-04-21 MED ORDER — SODIUM CHLORIDE 0.9 % IV SOLN
Freq: Once | INTRAVENOUS | Status: AC
  Administered 2015-04-21: 20:00:00 via INTRAVENOUS

## 2015-04-21 MED ORDER — POTASSIUM CHLORIDE CRYS ER 20 MEQ PO TBCR
40.0000 meq | EXTENDED_RELEASE_TABLET | Freq: Once | ORAL | Status: AC
  Administered 2015-04-21: 40 meq via ORAL
  Filled 2015-04-21: qty 2

## 2015-04-21 MED ORDER — ACETAMINOPHEN 650 MG RE SUPP
650.0000 mg | Freq: Four times a day (QID) | RECTAL | Status: DC | PRN
Start: 2015-04-21 — End: 2015-04-24

## 2015-04-21 MED ORDER — VANCOMYCIN HCL IN DEXTROSE 1-5 GM/200ML-% IV SOLN
1000.0000 mg | Freq: Once | INTRAVENOUS | Status: AC
  Administered 2015-04-21: 1000 mg via INTRAVENOUS
  Filled 2015-04-21: qty 200

## 2015-04-21 MED ORDER — ACETAMINOPHEN 325 MG PO TABS: 650.0000 mg | ORAL_TABLET | Freq: Four times a day (QID) | ORAL | Status: DC | PRN

## 2015-04-21 MED ORDER — HYDROCODONE-ACETAMINOPHEN 5-325 MG PO TABS: 1.0000 | ORAL_TABLET | ORAL | Status: DC | PRN

## 2015-04-21 MED ORDER — INFLUENZA VAC SPLIT QUAD 0.5 ML IM SUSY
0.5000 mL | PREFILLED_SYRINGE | INTRAMUSCULAR | Status: AC
  Administered 2015-04-22: 0.5 mL via INTRAMUSCULAR
  Filled 2015-04-21 (×2): qty 0.5

## 2015-04-21 MED ORDER — SODIUM CHLORIDE 0.9 % IJ SOLN
3.0000 mL | Freq: Two times a day (BID) | INTRAMUSCULAR | Status: DC
  Administered 2015-04-21 – 2015-04-24 (×6): 3 mL via INTRAVENOUS

## 2015-04-21 MED ORDER — PRIMIDONE 50 MG PO TABS
50.0000 mg | ORAL_TABLET | Freq: Every day | ORAL | Status: DC
  Administered 2015-04-21 – 2015-04-23 (×3): 50 mg via ORAL
  Filled 2015-04-21 (×4): qty 1

## 2015-04-21 MED ORDER — ONDANSETRON HCL 4 MG/2ML IJ SOLN: 4.0000 mg | Freq: Four times a day (QID) | INTRAMUSCULAR | Status: DC | PRN

## 2015-04-21 MED ORDER — VANCOMYCIN HCL 500 MG IV SOLR
500.0000 mg | INTRAVENOUS | Status: DC
  Administered 2015-04-22 – 2015-04-23 (×2): 500 mg via INTRAVENOUS
  Filled 2015-04-21 (×3): qty 500

## 2015-04-21 NOTE — ED Provider Notes (Signed)
CSN: [id number]     Arrival date & time 04/21/15  1828 History   First MD Initiated Contact with Patient 04/21/15 1829     Chief Complaint  Patient presents with  . Cellulitis  . Wound Infection     (Consider location/radiation/quality/duration/timing/severity/associated sxs/prior Treatment) HPI Comments: The patient is a 81 year old female, she is here complaining of left lower extremity swelling and rash with redness. This has been going on for several days, gradually worsening, associated with a small wound in the middle of the left lower extremity between the knee and ankle. No medications prior to arrival, sent here from urgent care when she was noticed to have significant swelling of her leg and significant cellulitis. She denies fevers chills nausea or vomiting.  The history is provided by the patient and medical records.    Past Medical History  Diagnosis Date  . Hypertension   . Tremor   . High cholesterol   . History of pelvic fracture   . History of measles   . History of mumps   . A-fib   . Parkinson disease    Past Surgical History  Procedure Laterality Date  . Tonsillectomy  1934  . Fracture pelvic bone  2011   Family History  Problem Relation Age of Onset  . Stroke Father   . Breast cancer Mother   . Gout Son   . Heart Problems Son     Triple Bypass  . Cancer Maternal Grandmother 42    breast cancer  . Cancer Maternal Grandfather 50    stomach cancer   Social History  Substance Use Topics  . Smoking status: Never Smoker   . Smokeless tobacco: Never Used  . Alcohol Use: No   OB History    No data available     Review of Systems  All other systems reviewed and are negative.     Allergies  Review of patient's allergies indicates no known allergies.  Home Medications   Prior to Admission medications   Medication Sig Start Date End Date Taking? Authorizing Provider  aspirin EC 325 MG tablet Take 1 tablet (325 mg total) by mouth daily.  11/03/13  Yes Mahima Glade Lloyd, MD  Cholecalciferol (VITAMIN D-3 PO) Take 2,000 Units by mouth daily.    Yes Historical Provider, MD  furosemide (LASIX) 20 MG tablet Take 1 and 1/2 tablet by mouth every morning for swelling of your legs. Patient taking differently: Take 20 mg by mouth daily.  01/19/15  Yes Sharon Seller, NP  HYDROcodone-acetaminophen (NORCO) 5-325 MG per tablet Take 1 tablet by mouth every 6 (six) hours as needed for moderate pain or severe pain. 03/25/15  Yes Madolyn Frieze, MD  metoprolol tartrate (LOPRESSOR) 25 MG tablet Take 1 tablet (25 mg total) by mouth 2 (two) times daily. 03/21/15  Yes Blane Ohara, MD  Omega-3 Fatty Acids (FISH OIL PO) Take 1 capsule by mouth daily.   Yes Historical Provider, MD  primidone (MYSOLINE) 50 MG tablet Take two tablets by mouth once daily 01/19/15  Yes Sharon Seller, NP  Tdap (BOOSTRIX) 5-2.5-18.5 LF-MCG/0.5 injection Inject 0.5 mLs into the muscle once. 03/23/15  Yes Tiffany L Reed, DO  traMADol (ULTRAM) 50 MG tablet Take 1 tablet (50 mg total) by mouth every 8 (eight) hours as needed. Patient taking differently: Take 50 mg by mouth every 8 (eight) hours as needed for moderate pain or severe pain.  03/23/15  Yes Tiffany L Reed, DO  buprenorphine (BUTRANS) 5 MCG/HR PTWK  patch Place 1 patch (5 mcg total) onto the skin once a week. Patient not taking: Reported on 04/21/2015 04/03/15   Tiffany L Reed, DO  lidocaine (LIDODERM) 5 % Place 1 patch onto the skin daily. Remove & Discard patch within 12 hours or as directed by MD Patient not taking: Reported on 04/21/2015 03/23/15   Tiffany L Reed, DO  Lidocaine-Tetracaine 70-70 MG PTCH Place 1 patch onto the skin daily. Remove & Discard patch within 12 hours or as directed by MD Patient not taking: Reported on 04/21/2015 04/03/15   Tiffany L Reed, DO   BP 103/57 mmHg  Pulse 64  Temp(Src) 97.8 F (36.6 C) (Oral)  Resp 16  SpO2 96% Physical Exam  Constitutional: She appears well-developed and well-nourished.  No distress.  HENT:  Head: Normocephalic and atraumatic.  Mouth/Throat: Oropharynx is clear and moist. No oropharyngeal exudate.  Eyes: Conjunctivae and EOM are normal. Pupils are equal, round, and reactive to light. Right eye exhibits no discharge. Left eye exhibits no discharge. No scleral icterus.  Neck: Normal range of motion. Neck supple. No JVD present. No thyromegaly present.  Cardiovascular: Normal heart sounds and intact distal pulses.  Exam reveals no gallop and no friction rub.   No murmur heard. Atrial fibrillation, strong pulses at the wrists and the feet  Pulmonary/Chest: Effort normal and breath sounds normal. No respiratory distress. She has no wheezes. She has no rales.  Abdominal: Soft. Bowel sounds are normal. She exhibits no distension and no mass. There is no tenderness.  Musculoskeletal: Normal range of motion. She exhibits edema and tenderness.  Lymphadenopathy:    She has no cervical adenopathy.  Neurological: She is alert. Coordination normal.  Skin: Skin is warm and dry. Rash noted. There is erythema.  Psychiatric: She has a normal mood and affect. Her behavior is normal.  Nursing note and vitals reviewed.   ED Course  Procedures (including critical care time) Labs Review Labs Reviewed  BASIC METABOLIC PANEL - Abnormal; Notable for the following:    Sodium 132 (*)    Potassium 3.3 (*)    Chloride 96 (*)    Glucose, Bld 126 (*)    All other components within normal limits  CBC WITH DIFFERENTIAL/PLATELET    Imaging Review Dg Tibia/fibula Left  04/21/2015   CLINICAL DATA:  Per EMS, pt sent from urgent care for cellulitis. Pt complains of redness, edema to her bilateral lower ankles. Pt has quarter sized wound to left lower anterior extremity with pain. Pt unable to recall any injury.  EXAM: LEFT TIBIA AND FIBULA - 2 VIEW  COMPARISON:  None.  FINDINGS: No fracture or bone lesion. Bones are demineralized. Ankle and knee joints are normally aligned.  There is  diffuse subcutaneous soft tissue edema. There is a small focal soft tissue defect along the anterior aspect of the lower leg. There is no radiopaque foreign body. No soft tissue air.  There is no area of bone resorption to suggest osteomyelitis.  IMPRESSION: No fracture or dislocation. No bone lesion. No evidence of osteomyelitis. No soft tissue air. Small soft tissue wound to the anterior lower leg.   Electronically Signed   By: Amie Portland M.D.   On: 04/21/2015 19:56   I have personally reviewed and evaluated these images and lab results as part of my medical decision-making.    MDM   Final diagnoses:  Cellulitis of left leg    Left lower extremities more swollen than the right, redness is not circumferential  but does surround the central wound which has some purulence. Suspect soft tissue infection, no indurated area, needs IV antibiotics and likely admission, rule out DVT.  D/w DR. Doutova who will see in ED and admit  Meds given in ED:  Medications  0.9 %  sodium chloride infusion ( Intravenous New Bag/Given 04/21/15 1930)  vancomycin (VANCOCIN) IVPB 1000 mg/200 mL premix (0 mg Intravenous Stopped 04/21/15 2030)   I have personally viewed and interpreted the imaging and agree with radiologist interpretation.    Eber Hong, MD 04/21/15 2102

## 2015-04-21 NOTE — Progress Notes (Signed)
ANTIBIOTIC CONSULT NOTE - INITIAL  Pharmacy Consult for Vancomycin Indication: Cellulitis  No Known Allergies  Patient Measurements: Height: 5' (152.4 cm) Weight: 110 lb 14.3 oz (50.3 kg) IBW/kg (Calculated) : 45.5  Vital Signs: Temp: 97.8 F (36.6 C) (08/26 1921) Temp Source: Oral (08/26 1921) BP: 103/57 mmHg (08/26 1921) Pulse Rate: 64 (08/26 1921) Intake/Output from previous day:   Intake/Output from this shift:    Labs:  Recent Labs  04/21/15 1901  WBC 6.0  HGB 12.9  PLT 217  CREATININE 0.62   Estimated Creatinine Clearance: 33.6 mL/min (by C-G formula based on Cr of 0.62). No results for input(s): VANCOTROUGH, VANCOPEAK, VANCORANDOM, GENTTROUGH, GENTPEAK, GENTRANDOM, TOBRATROUGH, TOBRAPEAK, TOBRARND, AMIKACINPEAK, AMIKACINTROU, AMIKACIN in the last 72 hours.   Microbiology: No results found for this or any previous visit (from the past 720 hour(s)).  Medical History: Past Medical History  Diagnosis Date  . Hypertension   . Tremor   . High cholesterol   . History of pelvic fracture   . History of measles   . History of mumps   . A-fib   . Parkinson disease     Medications:  Scheduled:  . [START ON 04/22/2015] aspirin EC  325 mg Oral Daily  . enoxaparin (LOVENOX) injection  40 mg Subcutaneous QHS  . metoprolol tartrate  12.5 mg Oral BID  . primidone  50 mg Oral QHS  . sodium chloride  3 mL Intravenous Q12H   Infusions:   Assessment:  81 yr female with chronic lower extremity edema presents with worsening with breakdown and drainage  Vancomycin 1gm IV x 1 given in ED  Pharmacy consulted to dose Vancomycin for cellulitis  Goal of Therapy:  Vancomycin trough level 10-15 mcg/ml  Plan:  Measure antibiotic drug levels at steady state  Vancomycin  IV q24h  Duff Pozzi, Joselyn Glassman, PharmD 04/21/2015,10:52 PM

## 2015-04-21 NOTE — Telephone Encounter (Signed)
Message on triage voice mail increase edema. Called Francella Solian PT with [facility] back, legs has increase edema and drainage, no doctors in office today suggest go to Urgent Care. She had just spoken to the daughter and told her if she couldn't get in her to go to Urgent Care.

## 2015-04-21 NOTE — H&P (Signed)
PCP: Bufford Spikes, DO    Referring provider  Fleet Contras   Chief Complaint:  Leg swelling  HPI: Alejandra Bailey is a 81 y.o. female   has a past medical history of Hypertension; Tremor; High cholesterol; History of pelvic fracture; History of measles; History of mumps; A-fib; and Parkinson disease.   Presented with  Of note in the end of July patient developed thoracic pain and diagnosed with thoracic compression fracture of undetermined chronicity. At that time she had a CT angio which showed no evidence of PE.  Home PT OT was arranged by her primary care provider.  Patient have been having chronic lower extremity edema but has gotten worse recently she started to have breakdown and drainage from the left tibial area. Family brought her in today to urgent care where she was diagnosed as cellulitis from there sent to emergency department. Patient does not endorse any fevers, no nausea,  no vomiting no diarrhea. Patient has known history of atrial fibrillation on aspirin no anticoagulation due to risk of falls.    Hospitalist was called for admission for low extremity cellulitis  Review of Systems:    Pertinent positives include:  Leg swelling  Constitutional:  No weight loss, night sweats, Fevers, chills, fatigue, weight loss  HEENT:  No headaches, Difficulty swallowing,Tooth/dental problems,Sore throat,  No sneezing, itching, ear ache, nasal congestion, post nasal drip,  Cardio-vascular:  No chest pain, Orthopnea, PND, anasarca, dizziness, palpitations.no Bilateral lower extremity swelling  GI:  No heartburn, indigestion, abdominal pain, nausea, vomiting, diarrhea, change in bowel habits, loss of appetite, melena, blood in stool, hematemesis Resp:  no shortness of breath at rest. No dyspnea on exertion, No excess mucus, no productive cough, No non-productive cough, No coughing up of blood.No change in color of mucus.No wheezing. Skin:  no rash or lesions. No jaundice GU:    no dysuria, change in color of urine, no urgency or frequency. No straining to urinate.  No flank pain.  Musculoskeletal:  No joint pain or no joint swelling. No decreased range of motion. No back pain.  Psych:  No change in mood or affect. No depression or anxiety. No memory loss.  Neuro: no localizing neurological complaints, no tingling, no weakness, no double vision, no gait abnormality, no slurred speech, no confusion  Otherwise ROS are negative except for above, 10 systems were reviewed  Past Medical History: Past Medical History  Diagnosis Date  . Hypertension   . Tremor   . High cholesterol   . History of pelvic fracture   . History of measles   . History of mumps   . A-fib   . Parkinson disease    Past Surgical History  Procedure Laterality Date  . Tonsillectomy  1934  . Fracture pelvic bone  2011     Medications: Prior to Admission medications   Medication Sig Start Date End Date Taking? Authorizing Provider  aspirin EC 325 MG tablet Take 1 tablet (325 mg total) by mouth daily. 11/03/13  Yes Mahima Glade Lloyd, MD  Cholecalciferol (VITAMIN D-3 PO) Take 2,000 Units by mouth daily.    Yes Historical Provider, MD  furosemide (LASIX) 20 MG tablet Take 1 and 1/2 tablet by mouth every morning for swelling of your legs. Patient taking differently: Take 20 mg by mouth daily.  01/19/15  Yes Sharon Seller, NP  HYDROcodone-acetaminophen (NORCO) 5-325 MG per tablet Take 1 tablet by mouth every 6 (six) hours as needed for moderate pain or severe pain. 03/25/15  Yes Madolyn Frieze, MD  metoprolol tartrate (LOPRESSOR) 25 MG tablet Take 1 tablet (25 mg total) by mouth 2 (two) times daily. 03/21/15  Yes Blane Ohara, MD  Omega-3 Fatty Acids (FISH OIL PO) Take 1 capsule by mouth daily.   Yes Historical Provider, MD  primidone (MYSOLINE) 50 MG tablet Take two tablets by mouth once daily 01/19/15  Yes Sharon Seller, NP  Tdap (BOOSTRIX) 5-2.5-18.5 LF-MCG/0.5 injection Inject 0.5 mLs into  the muscle once. 03/23/15  Yes Tiffany L Reed, DO  traMADol (ULTRAM) 50 MG tablet Take 1 tablet (50 mg total) by mouth every 8 (eight) hours as needed. Patient taking differently: Take 50 mg by mouth every 8 (eight) hours as needed for moderate pain or severe pain.  03/23/15  Yes Tiffany L Reed, DO  buprenorphine (BUTRANS) 5 MCG/HR PTWK patch Place 1 patch (5 mcg total) onto the skin once a week. Patient not taking: Reported on 04/21/2015 04/03/15   Tiffany L Reed, DO  lidocaine (LIDODERM) 5 % Place 1 patch onto the skin daily. Remove & Discard patch within 12 hours or as directed by MD Patient not taking: Reported on 04/21/2015 03/23/15   Tiffany L Reed, DO  Lidocaine-Tetracaine 70-70 MG PTCH Place 1 patch onto the skin daily. Remove & Discard patch within 12 hours or as directed by MD Patient not taking: Reported on 04/21/2015 04/03/15   Kermit Balo, DO    Allergies:  No Known Allergies  Social History:  Ambulatory walker   [facility].  Patient is hearing impaired.      reports that she has never smoked. She has never used smokeless tobacco. She reports that she does not drink alcohol or use illicit drugs.    Family History: family history includes Breast cancer in her mother; Cancer (age of onset: 75) in her maternal grandfather; Cancer (age of onset: 57) in her maternal grandmother; Gout in her son; Heart Problems in her son; Stroke in her father.    Physical Exam: Patient Vitals for the past 24 hrs:  BP Temp Temp src Pulse Resp SpO2  04/21/15 1921 103/57 mmHg 97.8 F (36.6 C) Oral 64 16 96 %  04/21/15 1836 113/59 mmHg 97.5 F (36.4 C) Oral 87 18 100 %    1. General:  in No Acute distress 2. Psychological: Alert and   Oriented 3. Head/ENT:   Moist  Mucous Membranes                          Head Non traumatic, neck supple                          Normal   Dentition 4. SKIN: normal   Skin turgor,  Skin clean Dry left lower extremity ulceration noted 5. Heart:  Regular rate and rhythm no Murmur, Rub or gallop 6. Lungs: Clear to auscultation bilaterally, no wheezes or crackles   7. Abdomen: Soft, non-tender, Non distended 8. Lower extremities: no clubbing, cyanosis, left leg edema and warmth 9. Neurologically Grossly intact, moving all 4 extremities equally 10. MSK: Normal range of motion  body mass index is unknown because there is no weight on file.   Labs on Admission:   Results for orders placed or performed during the hospital encounter of 04/21/15 (from the past 24 hour(s))  CBC with Differential/Platelet     Status: None   Collection Time: 04/21/15  7:01 PM  Result Value Ref Range   WBC 6.0 4.0 - 10.5 K/uL   RBC 4.21 3.87 - 5.11 MIL/uL   Hemoglobin 12.9 12.0 - 15.0 g/dL   HCT 16.1 09.6 - 04.5 %   MCV 91.9 78.0 - 100.0 fL   MCH 30.6 26.0 - 34.0 pg   MCHC 33.3 30.0 - 36.0 g/dL   RDW 40.9 81.1 - 91.4 %   Platelets 217 150 - 400 K/uL   Neutrophils Relative % 48 43 - 77 %   Neutro Abs 3.0 1.7 - 7.7 K/uL   Lymphocytes Relative 40 12 - 46 %   Lymphs Abs 2.4 0.7 - 4.0 K/uL   Monocytes Relative 9 3 - 12 %   Monocytes Absolute 0.5 0.1 - 1.0 K/uL   Eosinophils Relative 2 0 - 5 %   Eosinophils Absolute 0.1 0.0 - 0.7 K/uL   Basophils Relative 1 0 - 1 %   Basophils Absolute 0.0 0.0 - 0.1 K/uL  Basic metabolic panel     Status: Abnormal   Collection Time: 04/21/15  7:01 PM  Result Value Ref Range   Sodium 132 (L) 135 - 145 mmol/L   Potassium 3.3 (L) 3.5 - 5.1 mmol/L   Chloride 96 (L) 101 - 111 mmol/L   CO2 30 22 - 32 mmol/L   Glucose, Bld 126 (H) 65 - 99 mg/dL   BUN 16 6 - 20 mg/dL   Creatinine, Ser 7.82 0.44 - 1.00 mg/dL   Calcium 9.5 8.9 - 95.6 mg/dL   GFR calc non Af Amer >60 >60 mL/min   GFR calc Af Amer >60 >60 mL/min   Anion gap 6 5 - 15    UA not obtained  No results found for: HGBA1C  CrCl cannot be calculated (Unknown ideal weight.).  BNP (last 3 results) No results for input(s): PROBNP in the last 8760  hours.  Other results:  I have pearsonaly reviewed this: ECG REPORT  Not obtained   There were no vitals filed for this visit.   Cultures: No results found for: SDES, SPECREQUEST, CULT, REPTSTATUS   Radiological Exams on Admission: Dg Tibia/fibula Left  04/21/2015   CLINICAL DATA:  Per EMS, pt sent from urgent care for cellulitis. Pt complains of redness, edema to her bilateral lower ankles. Pt has quarter sized wound to left lower anterior extremity with pain. Pt unable to recall any injury.  EXAM: LEFT TIBIA AND FIBULA - 2 VIEW  COMPARISON:  None.  FINDINGS: No fracture or bone lesion. Bones are demineralized. Ankle and knee joints are normally aligned.  There is diffuse subcutaneous soft tissue edema. There is a small focal soft tissue defect along the anterior aspect of the lower leg. There is no radiopaque foreign body. No soft tissue air.  There is no area of bone resorption to suggest osteomyelitis.  IMPRESSION: No fracture or dislocation. No bone lesion. No evidence of osteomyelitis. No soft tissue air. Small soft tissue wound to the anterior lower leg.   Electronically Signed   By: Amie Portland M.D.   On: 04/21/2015 19:56    Chart has been reviewed  Family  at  Bedside  plan of care was discussed with Daughter In Jonette Eva Coderre  [phone]  Son Gerlene Burdock  Assessment/Plan  81 year old female with history of atrial fibrillation on aspirin and hypertension presents with left leg swelling and draining ulceration worrisome for cellulitis   Present on Admission:  . Cellulitis - admit for cellulitis protocol. Treat with vancomycin.  Given significant edema will evaluate for DVT withDoppler  . Essential hypertension, benign continue home medications but given somewhat soft blood pressures decreased dose of metoprolol and write holding parameters  . Hypokalemia will replace monitor on telemetry and recheck in the morning check magnesium level History of atrial fibrillation  - continue aspirin patient is a fall risk   Prophylaxis:   Lovenox   CODE STATUS:    DNR/DNI as per patient    Disposition:                            Back to current facility when stable                          Other plan as per orders.  I have spent a total of 55 min on this admission  Crystle Carelli 04/21/2015, 8:47 PM  [facility]  Pager [phone]   after 2 AM please page floor coverage PA If 7AM-7PM, please contact the day team taking care of the patient  [email or web address]  Password [email or web address]

## 2015-04-21 NOTE — ED Notes (Signed)
Per EMS, pt sent from urgent care for cellulitis. Pt complains of redness, edema to her bilateral lower ankles. Pt has quarter sized wound to left lower extremity.

## 2015-04-21 NOTE — Progress Notes (Signed)
EDCM spoke to patient and her daughter in law Romaine at bedside.  Patient has a son named Gerlene Burdock [phone] who can be called if needed.  Patient lives alone at independent living facility the [facility].  Romaine reports the patient is very independent, able to cook for herself, performs all ADL's without difficulty.  Patient has home health services with [facility] for PT per Romaine.  Patient has a walker, cane and bedside commode at home.  Romaine reports the patient does not use the bedside commode as she is ambulatory to the bathroom with her walker.  Patient without further [facility] needs at this time.  Patient's daughter in law thankful for services.

## 2015-04-22 ENCOUNTER — Inpatient Hospital Stay (HOSPITAL_COMMUNITY): Payer: Commercial Managed Care - HMO

## 2015-04-22 DIAGNOSIS — M79609 Pain in unspecified limb: Secondary | ICD-10-CM

## 2015-04-22 DIAGNOSIS — I10 Essential (primary) hypertension: Secondary | ICD-10-CM

## 2015-04-22 DIAGNOSIS — L03116 Cellulitis of left lower limb: Secondary | ICD-10-CM | POA: Diagnosis not present

## 2015-04-22 DIAGNOSIS — L8990 Pressure ulcer of unspecified site, unspecified stage: Secondary | ICD-10-CM | POA: Insufficient documentation

## 2015-04-22 DIAGNOSIS — E876 Hypokalemia: Secondary | ICD-10-CM

## 2015-04-22 LAB — COMPREHENSIVE METABOLIC PANEL
ALBUMIN: 3 g/dL — AB (ref 3.5–5.0)
ALK PHOS: 57 U/L (ref 38–126)
ALT: 9 U/L — ABNORMAL LOW (ref 14–54)
ANION GAP: 7 (ref 5–15)
AST: 15 U/L (ref 15–41)
BUN: 14 mg/dL (ref 6–20)
CALCIUM: 9.4 mg/dL (ref 8.9–10.3)
CO2: 28 mmol/L (ref 22–32)
Chloride: 99 mmol/L — ABNORMAL LOW (ref 101–111)
Creatinine, Ser: 0.66 mg/dL (ref 0.44–1.00)
GFR calc Af Amer: >60 mL/min (ref >60)
GFR calc non Af Amer: >60 mL/min (ref >60)
GLUCOSE: 95 mg/dL (ref 65–99)
POTASSIUM: 3.8 mmol/L (ref 3.5–5.1)
SODIUM: 134 mmol/L — AB (ref 135–145)
Total Bilirubin: 0.7 mg/dL (ref 0.3–1.2)
Total Protein: 5.5 g/dL — ABNORMAL LOW (ref 6.5–8.1)

## 2015-04-22 LAB — PHOSPHORUS: Phosphorus: 2.9 mg/dL (ref 2.5–4.6)

## 2015-04-22 LAB — CBC
HEMATOCRIT: 35.9 % — AB (ref 36.0–46.0)
HEMOGLOBIN: 11.8 g/dL — AB (ref 12.0–15.0)
MCH: 30.3 pg (ref 26.0–34.0)
MCHC: 32.9 g/dL (ref 30.0–36.0)
MCV: 92.3 fL (ref 78.0–100.0)
Platelets: 216 10*3/uL (ref 150–400)
RBC: 3.89 MIL/uL (ref 3.87–5.11)
RDW: 14.7 % (ref 11.5–15.5)
WBC: 5.8 10*3/uL (ref 4.0–10.5)

## 2015-04-22 LAB — TSH: TSH: 1.775 u[IU]/mL (ref 0.350–4.500)

## 2015-04-22 LAB — MAGNESIUM: MAGNESIUM: 1.9 mg/dL (ref 1.7–2.4)

## 2015-04-22 NOTE — Progress Notes (Signed)
PROGRESS NOTE    Alejandra Bailey ZOX:[id number] DOB: August 20, 1923 DOA: 04/21/2015 PCP: Bufford Spikes, DO  HPI/Brief narrative 81 year old female patient with history of HTN, HLD, A. fib, Parkinson's disease, recently diagnosed thoracic compression fractures in July, CTA chest at that time-no PE, chronic lower extremity edema, presented to [facility] ED on 04/21/15 with skin breakdown, drainage from left leg along with pain and redness. Admitted for left leg cellulitis.   Assessment/Plan:  Left leg cellulitis - Empirically started on IV vancomycin-continue same. - Left lower extremity venous Doppler: No DVT or SVT noted - X-ray without fracture, question myelitis or other acute findings. - Monitor  Essential hypertension - Controlled. At times blood pressures are low and hence on reduced dose of metoprolol. Monitor  Hypokalemia - Replaced  Atrial fibrillation - Reasonably controlled ventricular rate. - On aspirin and not on anticoagulation due to fall risk  Anemia - Follow CBCs  Parkinson's disease  -Continue primidone     DVT prophylaxis: Lovenox  Code Status: DO NOT RESUSCITATE  Family Communication: None at bedside  Disposition Plan: DC home when medically stable  Consultants:  None   Procedures:  None   Antibiotics:  IV vancomycin 8/26 >   Subjective: Chronic low back pain-better controlled since admission. Left leg pain on touching or movement. Denies any other complaints. As per nursing, no acute issues.  Objective: Filed Vitals:   04/21/15 2232 04/22/15 0217 04/22/15 0651 04/22/15 1100  BP:  93/51 133/87 104/55  Pulse:  105 107 87  Temp:  98.1 F (36.7 C) 98.3 F (36.8 C) 98.6 F (37 C)  TempSrc:  Oral Oral Oral  Resp:  16 16 18   Height: 5' (1.524 m)     Weight: 50.3 kg (110 lb 14.3 oz)     SpO2:  95% 94% 97%    Intake/Output Summary (Last 24 hours) at 04/22/15 1408 Last data filed at 04/22/15 0900  Gross per 24 hour  Intake     120 ml  Output      0 ml  Net    120 ml   Filed Weights   04/21/15 2232  Weight: 50.3 kg (110 lb 14.3 oz)     Exam:  General exam: Frail elderly female lying comfortably supine in bed.  Respiratory system: Clear. No increased work of breathing. Cardiovascular system: S1 & S2 heard, RRR. No JVD, murmurs, gallops, clicks or pedal edema.Telemetry: Atrial fibrillation with ventricular rate in the low 100s.  Gastrointestinal system: Abdomen is nondistended, soft and nontender. Normal bowel sounds heard. Central nervous system: Alert and oriented. No focal neurological deficits. Extremities: Symmetric 5 x 5 power.Left leg with scabbed lesion in the mid shin, erythema and warmth from mid leg below with associated tenderness but no fluctuance or open wounds or crepitus.   Data Reviewed: Basic Metabolic Panel:  Recent Labs Lab 04/21/15 1901 04/21/15 1902 04/22/15 0555  NA 132*  --  134*  K 3.3*  --  3.8  CL 96*  --  99*  CO2 30  --  28  GLUCOSE 126*  --  95  BUN 16  --  14  CREATININE 0.62  --  0.66  CALCIUM 9.5  --  9.4  MG  --  1.8 1.9  PHOS  --   --  2.9   Liver Function Tests:  Recent Labs Lab 04/22/15 0555  AST 15  ALT 9*  ALKPHOS 57  BILITOT 0.7  PROT 5.5*  ALBUMIN 3.0*  No results for input(s): LIPASE, AMYLASE in the last 168 hours. No results for input(s): AMMONIA in the last 168 hours. CBC:  Recent Labs Lab 04/21/15 1901 04/22/15 0555  WBC 6.0 5.8  NEUTROABS 3.0  --   HGB 12.9 11.8*  HCT 38.7 35.9*  MCV 91.9 92.3  PLT 217 216   Cardiac Enzymes: No results for input(s): CKTOTAL, CKMB, CKMBINDEX, TROPONINI in the last 168 hours. BNP (last 3 results) No results for input(s): PROBNP in the last 8760 hours. CBG: No results for input(s): GLUCAP in the last 168 hours.  No results found for this or any previous visit (from the past 240 hour(s)).        Studies: Dg Tibia/fibula Left  04/21/2015   CLINICAL DATA:  Per EMS, pt sent from urgent  care for cellulitis. Pt complains of redness, edema to her bilateral lower ankles. Pt has quarter sized wound to left lower anterior extremity with pain. Pt unable to recall any injury.  EXAM: LEFT TIBIA AND FIBULA - 2 VIEW  COMPARISON:  None.  FINDINGS: No fracture or bone lesion. Bones are demineralized. Ankle and knee joints are normally aligned.  There is diffuse subcutaneous soft tissue edema. There is a small focal soft tissue defect along the anterior aspect of the lower leg. There is no radiopaque foreign body. No soft tissue air.  There is no area of bone resorption to suggest osteomyelitis.  IMPRESSION: No fracture or dislocation. No bone lesion. No evidence of osteomyelitis. No soft tissue air. Small soft tissue wound to the anterior lower leg.   Electronically Signed   By: Amie Portland M.D.   On: 04/21/2015 19:56        Scheduled Meds: . aspirin EC  325 mg Oral Daily  . enoxaparin (LOVENOX) injection  40 mg Subcutaneous QHS  . metoprolol tartrate  12.5 mg Oral BID  . primidone  50 mg Oral QHS  . sodium chloride  3 mL Intravenous Q12H  . vancomycin  500 mg Intravenous Q24H   Continuous Infusions:   Active Problems:   Essential hypertension, benign   Cellulitis   Hypokalemia   Pressure ulcer    Time spent: 30 minutes   Khary Schaben, MD, FACP, FHM. [facility] Pager [phone]  If 7PM-7AM, please contact night-coverage [email or web address] Password [email or web address] 04/22/2015, 2:08 PM    LOS: 1 day

## 2015-04-22 NOTE — Progress Notes (Signed)
VASCULAR LAB PRELIMINARY  PRELIMINARY  PRELIMINARY  PRELIMINARY  Left lower extremity venous duplex completed.    Preliminary report:  There is no DVT or SVT noted in the left lower extremity.   Lanny Donoso, RVT 04/22/2015, 12:30 PM

## 2015-04-23 DIAGNOSIS — L0390 Cellulitis, unspecified: Secondary | ICD-10-CM

## 2015-04-23 LAB — BASIC METABOLIC PANEL
Anion gap: 4 — ABNORMAL LOW (ref 5–15)
BUN: 15 mg/dL (ref 6–20)
CALCIUM: 9.1 mg/dL (ref 8.9–10.3)
CO2: 30 mmol/L (ref 22–32)
CREATININE: 0.63 mg/dL (ref 0.44–1.00)
Chloride: 103 mmol/L (ref 101–111)
GFR calc Af Amer: >60 mL/min (ref >60)
GLUCOSE: 111 mg/dL — AB (ref 65–99)
Potassium: 4.2 mmol/L (ref 3.5–5.1)
Sodium: 137 mmol/L (ref 135–145)

## 2015-04-23 LAB — CBC
HEMATOCRIT: 35.8 % — AB (ref 36.0–46.0)
Hemoglobin: 11.7 g/dL — ABNORMAL LOW (ref 12.0–15.0)
MCH: 30.6 pg (ref 26.0–34.0)
MCHC: 32.7 g/dL (ref 30.0–36.0)
MCV: 93.7 fL (ref 78.0–100.0)
PLATELETS: 202 10*3/uL (ref 150–400)
RBC: 3.82 MIL/uL — ABNORMAL LOW (ref 3.87–5.11)
RDW: 14.8 % (ref 11.5–15.5)
WBC: 6.1 10*3/uL (ref 4.0–10.5)

## 2015-04-23 NOTE — Progress Notes (Addendum)
PROGRESS NOTE    GREYDIS STLOUIS KGM:[id number] DOB: April 05, 1923 DOA: 04/21/2015 PCP: Bufford Spikes, DO  HPI/Brief narrative 81 year old female patient with history of HTN, HLD, A. fib, Parkinson's disease, recently diagnosed thoracic compression fractures in July, CTA chest at that time-no PE, chronic lower extremity edema, presented to [facility] ED on 04/21/15 with skin breakdown, drainage from left leg along with pain and redness. Admitted for left leg cellulitis.   Assessment/Plan:  Left leg cellulitis - Empirically started on IV vancomycin-continue same. - Left lower extremity venous Doppler: No DVT or SVT noted - X-ray without fracture, question myelitis or other acute findings. - Significantly improved. Continue additional day of IV antibiotics and possible discharge in a.m. on oral antibiotics.  Essential hypertension - Controlled.  Hypokalemia - Replaced  Atrial fibrillation - Reasonably controlled ventricular rate. - On aspirin and not on anticoagulation due to fall risk  Anemia - Stable  Parkinson's disease  -Continue primidone     DVT prophylaxis: Lovenox  Code Status: DO NOT RESUSCITATE  Family Communication: None at bedside. Left message for son on 04/23/15. Discussed with patient's daughter-in-law Ms. Romaine Schoenfeld on 04/23/2015. Disposition Plan: DC home 8/29  Consultants:  None   Procedures:  None   Antibiotics:  IV vancomycin 8/26 >   Subjective: Feels much better. Denies leg pain. No other complaints reported. As per nursing, no acute issues.  Objective: Filed Vitals:   04/22/15 1952 04/23/15 0122 04/23/15 0602 04/23/15 1009  BP: 118/63 119/67 126/72 127/75  Pulse: 81 106 74 80  Temp: 98.3 F (36.8 C) 98.3 F (36.8 C) 98.3 F (36.8 C) 97.5 F (36.4 C)  TempSrc: Oral Oral Oral Oral  Resp: 18 18 16 16   Height:      Weight:      SpO2: 96% 95% 96% 98%    Intake/Output Summary (Last 24 hours) at 04/23/15 1245 Last  data filed at 04/23/15 0540  Gross per 24 hour  Intake    820 ml  Output      0 ml  Net    820 ml   Filed Weights   04/21/15 2232  Weight: 50.3 kg (110 lb 14.3 oz)     Exam:  General exam: Frail elderly female sitting up in bed eating breakfast this morning. Respiratory system: Clear. No increased work of breathing. Cardiovascular system: S1 & S2 heard, RRR. No JVD, murmurs, gallops, clicks or pedal edema. Gastrointestinal system: Abdomen is nondistended, soft and nontender. Normal bowel sounds heard. Central nervous system: Alert and oriented. No focal neurological deficits. Extremities: Symmetric 5 x 5 power.Left leg with scabbed lesion in the mid shin. Erythema and warmth from mid leg below with associated tenderness from yesterday have significantly improved.   Data Reviewed: Basic Metabolic Panel:  Recent Labs Lab 04/21/15 1901 04/21/15 1902 04/22/15 0555 04/23/15 0528  NA 132*  --  134* 137  K 3.3*  --  3.8 4.2  CL 96*  --  99* 103  CO2 30  --  28 30  GLUCOSE 126*  --  95 111*  BUN 16  --  14 15  CREATININE 0.62  --  0.66 0.63  CALCIUM 9.5  --  9.4 9.1  MG  --  1.8 1.9  --   PHOS  --   --  2.9  --    Liver Function Tests:  Recent Labs Lab 04/22/15 0555  AST 15  ALT 9*  ALKPHOS 57  BILITOT 0.7  PROT 5.5*  ALBUMIN 3.0*   No results for input(s): LIPASE, AMYLASE in the last 168 hours. No results for input(s): AMMONIA in the last 168 hours. CBC:  Recent Labs Lab 04/21/15 1901 04/22/15 0555 04/23/15 0528  WBC 6.0 5.8 6.1  NEUTROABS 3.0  --   --   HGB 12.9 11.8* 11.7*  HCT 38.7 35.9* 35.8*  MCV 91.9 92.3 93.7  PLT 217 216 202   Cardiac Enzymes: No results for input(s): CKTOTAL, CKMB, CKMBINDEX, TROPONINI in the last 168 hours. BNP (last 3 results) No results for input(s): PROBNP in the last 8760 hours. CBG: No results for input(s): GLUCAP in the last 168 hours.  No results found for this or any previous visit (from the past 240 hour(s)).         Studies: Dg Tibia/fibula Left  04/21/2015   CLINICAL DATA:  Per EMS, pt sent from urgent care for cellulitis. Pt complains of redness, edema to her bilateral lower ankles. Pt has quarter sized wound to left lower anterior extremity with pain. Pt unable to recall any injury.  EXAM: LEFT TIBIA AND FIBULA - 2 VIEW  COMPARISON:  None.  FINDINGS: No fracture or bone lesion. Bones are demineralized. Ankle and knee joints are normally aligned.  There is diffuse subcutaneous soft tissue edema. There is a small focal soft tissue defect along the anterior aspect of the lower leg. There is no radiopaque foreign body. No soft tissue air.  There is no area of bone resorption to suggest osteomyelitis.  IMPRESSION: No fracture or dislocation. No bone lesion. No evidence of osteomyelitis. No soft tissue air. Small soft tissue wound to the anterior lower leg.   Electronically Signed   By: Amie Portland M.D.   On: 04/21/2015 19:56        Scheduled Meds: . aspirin EC  325 mg Oral Daily  . enoxaparin (LOVENOX) injection  40 mg Subcutaneous QHS  . metoprolol tartrate  12.5 mg Oral BID  . primidone  50 mg Oral QHS  . sodium chloride  3 mL Intravenous Q12H  . vancomycin  500 mg Intravenous Q24H   Continuous Infusions:   Active Problems:   Essential hypertension, benign   Cellulitis   Hypokalemia   Pressure ulcer    Time spent: 20 minutes   HONGALGI,ANAND, MD, FACP, FHM. [facility] Pager [phone]  If 7PM-7AM, please contact night-coverage [email or web address] Password [email or web address] 04/23/2015, 12:45 PM    LOS: 2 days

## 2015-04-23 NOTE — Progress Notes (Signed)
Utilization review completed.  

## 2015-04-23 NOTE — Progress Notes (Signed)
PT Cancellation Note  Patient Details Name: XYLA LEISNER MRN: [id number] DOB: May 27, 1923   Cancelled Treatment:     PT eval order received but deferred at request of pt - had just been up with nursing.  Will see early in am.   Lulie Hurd 04/23/2015, 3:57 PM

## 2015-04-24 MED ORDER — DOXYCYCLINE HYCLATE 100 MG PO TABS: 100.0000 mg | ORAL_TABLET | Freq: Two times a day (BID) | ORAL | Status: DC

## 2015-04-24 MED ORDER — FUROSEMIDE 20 MG PO TABS: 20.0000 mg | ORAL_TABLET | Freq: Every day | ORAL | Status: DC

## 2015-04-24 MED ORDER — TRAMADOL HCL 50 MG PO TABS: 50.0000 mg | ORAL_TABLET | Freq: Three times a day (TID) | ORAL | Status: DC | PRN

## 2015-04-24 NOTE — Discharge Summary (Addendum)
Physician Discharge Summary  Alejandra Bailey GNF:[id number] DOB: 11-29-1922 DOA: 04/21/2015  PCP: Bufford Spikes, DO  Admit date: 04/21/2015 Discharge date: 04/24/2015  Time spent: Less than 30 minutes  Recommendations for Outpatient Follow-up:  1. Dr. Bufford Spikes, PCP in 4 days with repeat labs (CBC & BMP) 2. Home health PT.  Discharge Diagnoses:  Active Problems:   Essential hypertension, benign   Cellulitis   Hypokalemia   Pressure ulcer   Discharge Condition: Improved & Stable  Diet recommendation: Heart healthy diet.  Filed Weights   04/21/15 2232  Weight: 50.3 kg (110 lb 14.3 oz)    History of present illness:  81 year old female patient with history of HTN, HLD, A. fib, Parkinson's disease, recently diagnosed thoracic compression fractures in July, CTA chest at that time-no PE, chronic lower extremity edema, presented to [facility] ED on 04/21/15 with skin breakdown, drainage from left leg along with pain and redness. Admitted for left leg cellulitis.  Hospital Course:  Left leg cellulitis - Empirically started on IV vancomycin-completed 3 days so far - Left lower extremity venous Doppler: No DVT or SVT noted - X-ray without fracture, question myelitis or other acute findings. - Significantly improved. We will discharge on oral doxycycline to complete total 7 days course. Cellulitis features are almost resolved. She has been advised not to pick on or scratch the scab & allow to drop off by itself and she verbalizes understanding.  Essential hypertension - Controlled.  Hypokalemia - Replaced  Atrial fibrillation - Reasonably controlled ventricular rate. - On aspirin and not on anticoagulation due to fall risk  Anemia - Stable  Parkinson's disease  -Continue primidone   DO NOT RESUSCITATE    Consultants:  None  Procedures:  Left lower extremity venous Doppler 04/22/15: Left lower extremity venous duplex completed.   Preliminary report:  There is no DVT or SVT noted in the left lower extremity.    Discharge Exam:  Complaints: States that she continues to feel better. Indicates that her left leg feels much better. Denies left leg pain. No chest pain or dyspnea.  Filed Vitals:   04/23/15 1838 04/23/15 2228 04/24/15 0436 04/24/15 0751  BP: 157/81 130/81 163/87   Pulse: 96 90 78 80  Temp: 97.4 F (36.3 C) 97.5 F (36.4 C) 97.5 F (36.4 C)   TempSrc: Oral Oral Oral   Resp: Height:      Weight:      SpO2: 100% 100% 99%     General exam: Frail elderly female lying comfortably supine in bed Respiratory system: Clear. No increased work of breathing. Cardiovascular system: S1 & S2 heard, RRR. No JVD, murmurs, gallops, clicks or pedal edema. Gastrointestinal system: Abdomen is nondistended, soft and nontender. Normal bowel sounds heard. Central nervous system: Alert and oriented. No focal neurological deficits. Extremities: Symmetric 5 x 5 power.Left leg with scabbed lesion in the mid shin. Erythema and warmth from mid leg below with associated tenderness have almost resolved  Discharge Instructions      Discharge Instructions    (HEART FAILURE PATIENTS) Call MD:  Anytime you have any of the following symptoms: 1) 3 pound weight gain in 24 hours or 5 pounds in 1 week 2) shortness of breath, with or without a dry hacking cough 3) swelling in the hands, feet or stomach 4) if you have to sleep on extra pillows at night in order to breathe.    Complete by:  As directed  Call MD for:  difficulty breathing, headache or visual disturbances    Complete by:  As directed      Call MD for:  extreme fatigue    Complete by:  As directed      Call MD for:  hives    Complete by:  As directed      Call MD for:  persistant dizziness or light-headedness    Complete by:  As directed      Call MD for:  persistant nausea and vomiting    Complete by:  As directed      Call MD for:  redness, tenderness, or signs of  infection (pain, swelling, redness, odor or green/yellow discharge around incision site)    Complete by:  As directed      Call MD for:  severe uncontrolled pain    Complete by:  As directed      Call MD for:  temperature >100.4    Complete by:  As directed      Diet - low sodium heart healthy    Complete by:  As directed      Increase activity slowly    Complete by:  As directed             Medication List    STOP taking these medications        buprenorphine 5 MCG/HR Ptwk patch  Commonly known as:  BUTRANS     HYDROcodone-acetaminophen 5-325 MG per tablet  Commonly known as:  NORCO     lidocaine 5 %  Commonly known as:  LIDODERM     Lidocaine-Tetracaine 70-70 MG Ptch     Tdap 5-2.5-18.5 LF-MCG/0.5 injection  Commonly known as:  BOOSTRIX      TAKE these medications        aspirin EC 325 MG tablet  Take 1 tablet (325 mg total) by mouth daily.     doxycycline 100 MG tablet  Commonly known as:  VIBRA-TABS  Take 1 tablet (100 mg total) by mouth 2 (two) times daily.     FISH OIL PO  Take 1 capsule by mouth daily.     furosemide 20 MG tablet  Commonly known as:  LASIX  Take 1 tablet (20 mg total) by mouth daily.     metoprolol tartrate 25 MG tablet  Commonly known as:  LOPRESSOR  Take 1 tablet (25 mg total) by mouth 2 (two) times daily.     primidone 50 MG tablet  Commonly known as:  MYSOLINE  Take two tablets by mouth once daily     traMADol 50 MG tablet  Commonly known as:  ULTRAM  Take 1 tablet (50 mg total) by mouth every 8 (eight) hours as needed for moderate pain or severe pain.     VITAMIN D-3 PO  Take 2,000 Units by mouth daily.       Follow-up Information    Follow up with REED, TIFFANY, DO. Schedule an appointment as soon as possible for a visit in 4 days.   Specialty:  Geriatric Medicine   Why:  To be seen with repeat labs (CBC & BMP).   Contact information:   [address] [place] [address] [phone]        The results of  significant diagnostics from this hospitalization (including imaging, microbiology, ancillary and laboratory) are listed below for reference.    Significant Diagnostic Studies: Dg Tibia/fibula Left  04/21/2015   CLINICAL DATA:  Per EMS, pt sent from urgent  care for cellulitis. Pt complains of redness, edema to her bilateral lower ankles. Pt has quarter sized wound to left lower anterior extremity with pain. Pt unable to recall any injury.  EXAM: LEFT TIBIA AND FIBULA - 2 VIEW  COMPARISON:  None.  FINDINGS: No fracture or bone lesion. Bones are demineralized. Ankle and knee joints are normally aligned.  There is diffuse subcutaneous soft tissue edema. There is a small focal soft tissue defect along the anterior aspect of the lower leg. There is no radiopaque foreign body. No soft tissue air.  There is no area of bone resorption to suggest osteomyelitis.  IMPRESSION: No fracture or dislocation. No bone lesion. No evidence of osteomyelitis. No soft tissue air. Small soft tissue wound to the anterior lower leg.   Electronically Signed   By: Amie Portland M.D.   On: 04/21/2015 19:56    Microbiology: No results found for this or any previous visit (from the past 240 hour(s)).   Labs: Basic Metabolic Panel:  Recent Labs Lab 04/21/15 1901 04/21/15 1902 04/22/15 0555 04/23/15 0528  NA 132*  --  134* 137  K 3.3*  --  3.8 4.2  CL 96*  --  99* 103  CO2 30  --  28 30  GLUCOSE 126*  --  95 111*  BUN 16  --  14 15  CREATININE 0.62  --  0.66 0.63  CALCIUM 9.5  --  9.4 9.1  MG  --  1.8 1.9  --   PHOS  --   --  2.9  --    Liver Function Tests:  Recent Labs Lab 04/22/15 0555  AST 15  ALT 9*  ALKPHOS 57  BILITOT 0.7  PROT 5.5*  ALBUMIN 3.0*   No results for input(s): LIPASE, AMYLASE in the last 168 hours. No results for input(s): AMMONIA in the last 168 hours. CBC:  Recent Labs Lab 04/21/15 1901 04/22/15 0555 04/23/15 0528  WBC 6.0 5.8 6.1  NEUTROABS 3.0  --   --   HGB 12.9 11.8*  11.7*  HCT 38.7 35.9* 35.8*  MCV 91.9 92.3 93.7  PLT 217 216 202   Cardiac Enzymes: No results for input(s): CKTOTAL, CKMB, CKMBINDEX, TROPONINI in the last 168 hours. BNP: BNP (last 3 results) No results for input(s): BNP in the last 8760 hours.  ProBNP (last 3 results) No results for input(s): PROBNP in the last 8760 hours.  CBG: No results for input(s): GLUCAP in the last 168 hours.     Signed:  Marcellus Scott, MD, FACP, FHM. [facility] Pager [phone]  If 7PM-7AM, please contact night-coverage [email or web address] Password [email or web address] 04/24/2015, 1:37 PM

## 2015-04-24 NOTE — Evaluation (Signed)
Physical Therapy Evaluation Patient Details Name: Alejandra Bailey MRN: [id number] DOB: May 17, 1923 Today's Date: 04/24/2015   History of Present Illness  Alejandra Bailey is a 81 y.o. female adm with LE cellulitis; PMHx: thoracic compression fx, parkinson's, afib  Clinical Impression  Pt will benefit from PT services, plans for d/c today so will sign off and defer further intervention to HHPT;  Pt reports she has a RW at home    Follow Up Recommendations Home health PT    Equipment Recommendations  None recommended by PT    Recommendations for Other Services       Precautions / Restrictions Precautions Precautions: Fall      Mobility  Bed Mobility Overal bed mobility: Modified Independent                Transfers Overall transfer level: Needs assistance Equipment used: Rolling walker (2 wheeled) Transfers: Sit to/from Stand Sit to Stand: Supervision         General transfer comment: cues for safety  Ambulation/Gait Ambulation/Gait assistance: Min guard;Supervision Ambulation Distance (Feet): 190 Feet Assistive device: Rolling walker (2 wheeled) Gait Pattern/deviations: Step-through pattern;Trunk flexed     General Gait Details: cues for posture, RW safety and distance from [employer]            Wheelchair Mobility    Modified Rankin (Stroke Patients Only)       Balance Overall balance assessment: Needs assistance Sitting-balance support: Feet supported;No upper extremity supported Sitting balance-Leahy Scale: Good     Standing balance support: During functional activity;Bilateral upper extremity supported;Single extremity supported Standing balance-Leahy Scale: Fair                               Pertinent Vitals/Pain Pain Assessment: No/denies pain    Home Living Family/patient expects to be discharged to:: Private residence Living Arrangements: Alone   Type of Home: Apartment       Home Layout: One level Home  Equipment: Environmental consultant - 2 wheels      Prior Function Level of Independence: Independent with assistive device(s)         Comments: per pt she amb with RW      Hand Dominance        Extremity/Trunk Assessment   Upper Extremity Assessment: Overall WFL for tasks assessed           Lower Extremity Assessment: Overall WFL for tasks assessed      Cervical / Trunk Assessment: Kyphotic  Communication   Communication: No difficulties  Cognition Arousal/Alertness: Awake/alert Behavior During Therapy: WFL for tasks assessed/performed Overall Cognitive Status: No family/caregiver present to determine baseline cognitive functioning Area of Impairment: Problem solving;Safety/judgement     Memory: Decreased short-term memory   Safety/Judgement: Decreased awareness of safety   Problem Solving: Requires verbal cues General Comments: pt repeats that "Romaine" is coming to get her and take her to her room; she does not express awareness of the current situation    General Comments      Exercises        Assessment/Plan    PT Assessment All further PT needs can be met in the next venue of care  PT Diagnosis Difficulty walking   PT Problem List    PT Treatment Interventions     PT Goals (Current goals can be found in the Care Plan section) Acute Rehab PT Goals PT Goal Formulation: With patient (pt to D/C today)  Potential to Achieve Goals: Good    Frequency     Barriers to discharge        Co-evaluation               End of Session Equipment Utilized During Treatment: Gait belt Activity Tolerance: Patient tolerated treatment well Patient left: in chair;with call bell/phone within reach;with chair alarm set Nurse Communication: Mobility status         Time: 1214-1227 PT Time Calculation (min) (ACUTE ONLY): 13 min   Charges:   PT Evaluation $Initial PT Evaluation Tier I: 1 Procedure     PT G CodesKenyon Ana Apr 25, 2015, 2:09 PM

## 2015-04-24 NOTE — Progress Notes (Addendum)
RN spoke with Physical therapist about recommendations. PT Alejandra Bailey) recommends HHPT at discharge.  Alejandra Bailey. Alejandra Croft, RN

## 2015-04-24 NOTE — Care Management Important Message (Signed)
Important Message  Patient Details  Name: Alejandra Bailey MRN: [id number] Date of Birth: 1923-07-30   Medicare Important Message Given:  [facility] notification given    Haskell Flirt 04/24/2015, 12:15 PMImportant Message  Patient Details  Name: Alejandra Bailey MRN: [id number] Date of Birth: 18-Jun-1923   Medicare Important Message Given:  Yes-second notification given    Haskell Flirt 04/24/2015, 12:15 PM

## 2015-04-24 NOTE — Discharge Instructions (Signed)

## 2015-04-24 NOTE — Progress Notes (Signed)
Pt declined HHPT at present time. Pt states, I have somebody to help me, that walks me, I don't need anyone.

## 2015-04-27 ENCOUNTER — Ambulatory Visit: Payer: Self-pay | Admitting: Internal Medicine

## 2015-05-03 ENCOUNTER — Telehealth: Payer: Self-pay

## 2015-05-03 NOTE — Telephone Encounter (Signed)
Pending appointment on 05/05/15 for ER follow-up

## 2015-05-03 NOTE — Telephone Encounter (Signed)
Kim from [facility] called to get orders to resume Physical Therapy, order given.  FYI patient was found on the floor (by [facility] therapist) following a fall from recliner, EMS was called and she was evaluated and had no visible injury. (FYI)

## 2015-05-04 NOTE — Telephone Encounter (Signed)
Noted. Thanks.

## 2015-05-05 ENCOUNTER — Encounter (HOSPITAL_COMMUNITY): Payer: Self-pay

## 2015-05-05 ENCOUNTER — Emergency Department (HOSPITAL_COMMUNITY): Payer: Commercial Managed Care - HMO

## 2015-05-05 ENCOUNTER — Observation Stay (HOSPITAL_COMMUNITY)
Admission: EM | Admit: 2015-05-05 | Discharge: 2015-05-07 | Disposition: A | Discharge status: Home / Self Care | Payer: Commercial Managed Care - HMO | Attending: Internal Medicine | Admitting: Internal Medicine

## 2015-05-05 ENCOUNTER — Ambulatory Visit: Payer: Commercial Managed Care - HMO | Admitting: Internal Medicine

## 2015-05-05 ENCOUNTER — Observation Stay (HOSPITAL_COMMUNITY): Payer: Commercial Managed Care - HMO

## 2015-05-05 ENCOUNTER — Telehealth: Payer: Self-pay | Admitting: *Deleted

## 2015-05-05 DIAGNOSIS — L03115 Cellulitis of right lower limb: Secondary | ICD-10-CM | POA: Insufficient documentation

## 2015-05-05 DIAGNOSIS — R0902 Hypoxemia: Secondary | ICD-10-CM | POA: Insufficient documentation

## 2015-05-05 DIAGNOSIS — G20 Parkinson's disease: Secondary | ICD-10-CM | POA: Diagnosis not present

## 2015-05-05 DIAGNOSIS — L0390 Cellulitis, unspecified: Secondary | ICD-10-CM | POA: Diagnosis not present

## 2015-05-05 DIAGNOSIS — I10 Essential (primary) hypertension: Secondary | ICD-10-CM | POA: Diagnosis not present

## 2015-05-05 DIAGNOSIS — E780 Pure hypercholesterolemia: Secondary | ICD-10-CM | POA: Diagnosis not present

## 2015-05-05 DIAGNOSIS — E876 Hypokalemia: Secondary | ICD-10-CM | POA: Diagnosis present

## 2015-05-05 DIAGNOSIS — I482 Chronic atrial fibrillation: Secondary | ICD-10-CM

## 2015-05-05 DIAGNOSIS — I4891 Unspecified atrial fibrillation: Secondary | ICD-10-CM | POA: Insufficient documentation

## 2015-05-05 DIAGNOSIS — L03116 Cellulitis of left lower limb: Secondary | ICD-10-CM | POA: Diagnosis present

## 2015-05-05 DIAGNOSIS — Z7982 Long term (current) use of aspirin: Secondary | ICD-10-CM | POA: Insufficient documentation

## 2015-05-05 DIAGNOSIS — F0390 Unspecified dementia without behavioral disturbance: Secondary | ICD-10-CM | POA: Diagnosis present

## 2015-05-05 DIAGNOSIS — R4182 Altered mental status, unspecified: Secondary | ICD-10-CM | POA: Diagnosis not present

## 2015-05-05 DIAGNOSIS — Z79899 Other long term (current) drug therapy: Secondary | ICD-10-CM | POA: Diagnosis not present

## 2015-05-05 DIAGNOSIS — R600 Localized edema: Secondary | ICD-10-CM | POA: Diagnosis not present

## 2015-05-05 DIAGNOSIS — R609 Edema, unspecified: Secondary | ICD-10-CM | POA: Diagnosis present

## 2015-05-05 HISTORY — DX: Family history of other specified conditions: Z84.89

## 2015-05-05 HISTORY — DX: Unspecified osteoarthritis, unspecified site: M19.90

## 2015-05-05 HISTORY — DX: Other chronic pain: G89.29

## 2015-05-05 HISTORY — DX: Dorsalgia, unspecified: M54.9

## 2015-05-05 LAB — CBC WITH DIFFERENTIAL/PLATELET
Basophils Absolute: 0 10*3/uL (ref 0.0–0.1)
Basophils Relative: 1 % (ref 0–1)
Eosinophils Absolute: 0.1 10*3/uL (ref 0.0–0.7)
Eosinophils Relative: 2 % (ref 0–5)
HEMATOCRIT: 41.7 % (ref 36.0–46.0)
HEMOGLOBIN: 13.8 g/dL (ref 12.0–15.0)
LYMPHS ABS: 1.4 10*3/uL (ref 0.7–4.0)
LYMPHS PCT: 26 % (ref 12–46)
MCH: 30.4 pg (ref 26.0–34.0)
MCHC: 33.1 g/dL (ref 30.0–36.0)
MCV: 91.9 fL (ref 78.0–100.0)
Monocytes Absolute: 0.6 10*3/uL (ref 0.1–1.0)
Monocytes Relative: 10 % (ref 3–12)
NEUTROS ABS: 3.3 10*3/uL (ref 1.7–7.7)
NEUTROS PCT: 61 % (ref 43–77)
Platelets: 235 10*3/uL (ref 150–400)
RBC: 4.54 MIL/uL (ref 3.87–5.11)
RDW: 15 % (ref 11.5–15.5)
WBC: 5.4 10*3/uL (ref 4.0–10.5)

## 2015-05-05 LAB — URINALYSIS, ROUTINE W REFLEX MICROSCOPIC
Glucose, UA: NEGATIVE mg/dL
Hgb urine dipstick: NEGATIVE
Ketones, ur: 15 mg/dL — AB
Leukocytes, UA: NEGATIVE
Nitrite: NEGATIVE
Protein, ur: 30 mg/dL — AB
Specific Gravity, Urine: 1.024 (ref 1.005–1.030)
Urobilinogen, UA: 1 mg/dL (ref 0.0–1.0)
pH: 5 (ref 5.0–8.0)

## 2015-05-05 LAB — URINE MICROSCOPIC-ADD ON

## 2015-05-05 LAB — COMPREHENSIVE METABOLIC PANEL
ALK PHOS: 56 U/L (ref 38–126)
ALT: 19 U/L (ref 14–54)
AST: 27 U/L (ref 15–41)
Albumin: 3.6 g/dL (ref 3.5–5.0)
Anion gap: 8 (ref 5–15)
BUN: 18 mg/dL (ref 6–20)
CALCIUM: 10.3 mg/dL (ref 8.9–10.3)
CO2: 27 mmol/L (ref 22–32)
CREATININE: 0.8 mg/dL (ref 0.44–1.00)
Chloride: 104 mmol/L (ref 101–111)
GFR calc non Af Amer: >60 mL/min (ref >60)
Glucose, Bld: 97 mg/dL (ref 65–99)
Potassium: 3.3 mmol/L — ABNORMAL LOW (ref 3.5–5.1)
SODIUM: 139 mmol/L (ref 135–145)
Total Bilirubin: 1.2 mg/dL (ref 0.3–1.2)
Total Protein: 6.5 g/dL (ref 6.5–8.1)

## 2015-05-05 LAB — I-STAT CG4 LACTIC ACID, ED: Lactic Acid, Venous: 1.66 mmol/L (ref 0.5–2.0)

## 2015-05-05 LAB — I-STAT TROPONIN, ED: TROPONIN I, POC: 0.01 ng/mL (ref 0.00–0.08)

## 2015-05-05 LAB — TSH: TSH: 1.263 u[IU]/mL (ref 0.350–4.500)

## 2015-05-05 MED ORDER — METOPROLOL TARTRATE 25 MG PO TABS
25.0000 mg | ORAL_TABLET | Freq: Two times a day (BID) | ORAL | Status: DC
  Administered 2015-05-06 (×2): 25 mg via ORAL
  Filled 2015-05-05 (×4): qty 1

## 2015-05-05 MED ORDER — POTASSIUM CHLORIDE 20 MEQ/15ML (10%) PO SOLN
40.0000 meq | Freq: Every day | ORAL | Status: DC
  Administered 2015-05-06 – 2015-05-07 (×2): 40 meq via ORAL
  Filled 2015-05-05 (×3): qty 30

## 2015-05-05 MED ORDER — VANCOMYCIN HCL IN DEXTROSE 1-5 GM/200ML-% IV SOLN
1000.0000 mg | Freq: Once | INTRAVENOUS | Status: AC
  Administered 2015-05-05: 1000 mg via INTRAVENOUS
  Filled 2015-05-05: qty 200

## 2015-05-05 MED ORDER — STROKE: EARLY STAGES OF RECOVERY BOOK
Freq: Once | Status: DC
  Filled 2015-05-05: qty 1

## 2015-05-05 MED ORDER — VITAMIN D3 25 MCG (1000 UNIT) PO TABS
2000.0000 [IU] | ORAL_TABLET | Freq: Every day | ORAL | Status: DC
  Administered 2015-05-06 – 2015-05-07 (×2): 2000 [IU] via ORAL
  Filled 2015-05-05 (×6): qty 2

## 2015-05-05 MED ORDER — PRIMIDONE 50 MG PO TABS
50.0000 mg | ORAL_TABLET | Freq: Every day | ORAL | Status: DC
  Administered 2015-05-06: 50 mg via ORAL
  Filled 2015-05-05 (×3): qty 1

## 2015-05-05 MED ORDER — ASPIRIN EC 325 MG PO TBEC
325.0000 mg | DELAYED_RELEASE_TABLET | Freq: Every day | ORAL | Status: DC
  Administered 2015-05-06 – 2015-05-07 (×2): 325 mg via ORAL
  Filled 2015-05-05 (×3): qty 1

## 2015-05-05 MED ORDER — OMEGA-3-ACID ETHYL ESTERS 1 G PO CAPS
1.0000 g | ORAL_CAPSULE | Freq: Every day | ORAL | Status: DC
Start: 2015-05-05 — End: 2015-05-07
  Administered 2015-05-06 – 2015-05-07 (×2): 1 g via ORAL
  Filled 2015-05-05 (×3): qty 1

## 2015-05-05 MED ORDER — ENOXAPARIN SODIUM 30 MG/0.3ML ~~LOC~~ SOLN
30.0000 mg | SUBCUTANEOUS | Status: DC
  Administered 2015-05-05 – 2015-05-06 (×2): 30 mg via SUBCUTANEOUS
  Filled 2015-05-05 (×2): qty 0.3

## 2015-05-05 MED ORDER — SENNOSIDES-DOCUSATE SODIUM 8.6-50 MG PO TABS: 1.0000 | ORAL_TABLET | Freq: Every evening | ORAL | Status: DC | PRN

## 2015-05-05 MED ORDER — FUROSEMIDE 20 MG PO TABS
20.0000 mg | ORAL_TABLET | Freq: Every day | ORAL | Status: DC
  Administered 2015-05-06: 20 mg via ORAL
  Filled 2015-05-05 (×3): qty 1

## 2015-05-05 NOTE — ED Notes (Signed)
Attempted to call report to floor.  RN not available to take report, will call back.

## 2015-05-05 NOTE — ED Notes (Signed)
Pt brought in EMS from home for mechanical fall.  Pt reports she slipped Wednesday, falling and hitting the right side of her head.  Pt denies LOC and reports she was assisted up by her physical therapist "quite a while later."  Pt has bruising to right periorbital area.  Pt daughter in law reports that pt is more confused than usual.  Unknown blood thinner use.

## 2015-05-05 NOTE — H&P (Signed)
[facility] History and Physical  CARIGAN LISTER ZOX:[id number] DOB: 15-Aug-1923 DOA: 05/05/2015  Referring physician: Ms. Catha Gosselin, PA (ED) PCP: Bufford Spikes, DO  Specialists:   Chief Complaint: Altered mental status  HPI: Alejandra Bailey is a 81 y.o. female  With a history of lower extremity swelling, recent hospitalization for cellulitis, hypertension, atrial fibrillation, that presented to the emergency department with altered mental status. Per her daughter-in-law at bedside, patient has been speaking differently and nonsensical over the past 2 days. Patient has been more forgetful over the past month. Patient was recently admitted for cellulitis and completed course of doxycycline. Patient was to go to her primary care physician for follow-up of her cellulitis today however when daughter-in-law spoke to her on the phone she seemed different.  When she went to the patient's house, she found her slumped over. Patient admits to falling a couple of days ago.  In the emergency department, patient had a CT of the head which was negative. [facility] was called for admission. Upon questioning, patient sternal states that the patient is currently back at her baseline.  Review of Systems:  Constitutional: Denies fever, chills, diaphoresis, appetite change and fatigue.  HEENT: Denies photophobia, eye pain, redness, hearing loss, ear pain, congestion, sore throat, rhinorrhea, sneezing, mouth sores, trouble swallowing, neck pain, neck stiffness and tinnitus.   Respiratory: Denies SOB, DOE, cough, chest tightness,  and wheezing.   Cardiovascular: Denies chest pain, palpitations and leg swelling.  Gastrointestinal: Denies nausea, vomiting, abdominal pain, diarrhea, constipation, blood in stool and abdominal distention.  Genitourinary: Denies dysuria, urgency, frequency, hematuria, flank pain and difficulty urinating.  Musculoskeletal: Denies myalgias, back pain, joint swelling, arthralgias and  gait problem.  Skin: Denies pallor, rash and wound.  Neurological: Admits to some confusion.  Hematological: Denies adenopathy. Easy bruising, personal or family bleeding history  Psychiatric/Behavioral: Denies suicidal ideation, mood changes, confusion, nervousness, sleep disturbance and agitation  Past Medical History  Diagnosis Date  . Hypertension   . Tremor   . High cholesterol   . History of pelvic fracture   . History of measles   . History of mumps   . A-fib   . Parkinson disease    Past Surgical History  Procedure Laterality Date  . Tonsillectomy  1934  . Fracture pelvic bone  2011   Social History:  reports that she has never smoked. She has never used smokeless tobacco. She reports that she does not drink alcohol or use illicit drugs.   No Known Allergies  Family History  Problem Relation Age of Onset  . Stroke Father   . Breast cancer Mother   . Gout Son   . Heart Problems Son     Triple Bypass  . Cancer Maternal Grandmother 20    breast cancer  . Cancer Maternal Grandfather 50    stomach cancer   Prior to Admission medications   Medication Sig Start Date End Date Taking? Authorizing Provider  aspirin EC 325 MG tablet Take 1 tablet (325 mg total) by mouth daily. 11/03/13   Oneal Grout, MD  benazepril-hydrochlorthiazide (LOTENSIN HCT) 20-25 MG per tablet Take 1 tablet by mouth daily. 04/30/15   Historical Provider, MD  Cholecalciferol (VITAMIN D-3 PO) Take 2,000 Units by mouth daily.     Historical Provider, MD  doxycycline (VIBRA-TABS) 100 MG tablet Take 1 tablet (100 mg total) by mouth 2 (two) times daily. 04/24/15   Elease Etienne, MD  furosemide (LASIX) 20 MG tablet Take 1 tablet (  20 mg total) by mouth daily. 04/24/15   Elease Etienne, MD  metoprolol tartrate (LOPRESSOR) 25 MG tablet Take 1 tablet (25 mg total) by mouth 2 (two) times daily. 03/21/15   Blane Ohara, MD  Omega-3 Fatty Acids (FISH OIL PO) Take 1 capsule by mouth daily.    Historical  Provider, MD  primidone (MYSOLINE) 50 MG tablet Take two tablets by mouth once daily 01/19/15   Sharon Seller, NP  traMADol (ULTRAM) 50 MG tablet Take 1 tablet (50 mg total) by mouth every 8 (eight) hours as needed for moderate pain or severe pain. 04/24/15   Elease Etienne, MD   Physical Exam: Filed Vitals:   05/05/15 1430  BP: 150/95  Pulse: 83  Temp:   Resp:      General: Well developed, frail, NAD, appears stated age  HEENT: [place], ecchymosis noted above the right eye, PERRLA, EOMI, Anicteic Sclera, mucous membranes moist.   Neck: Supple, no JVD, no masses  Cardiovascular: S1 S2 auscultated, irregularly irregular  Respiratory: Clear to auscultation bilaterally with equal chest rise  Abdomen: Soft, nontender, nondistended, + bowel sounds  Extremities: warm dry without cyanosis clubbing. 2+ pitting edema LE B/L   Neuro: AAOx3, cranial nerves grossly intact. Strength equal and bilateral in upper/lower ext  Skin: Without rashes exudates or nodules, LE- slight erythema L>R, small open scab RLE  Psych: Normal affect and demeanor with intact judgement and insight  Labs on Admission:  Basic Metabolic Panel:  Recent Labs Lab 05/05/15 1235  NA 139  K 3.3*  CL 104  CO2 27  GLUCOSE 97  BUN 18  CREATININE 0.80  CALCIUM 10.3   Liver Function Tests:  Recent Labs Lab 05/05/15 1235  AST 27  ALT 19  ALKPHOS 56  BILITOT 1.2  PROT 6.5  ALBUMIN 3.6   No results for input(s): LIPASE, AMYLASE in the last 168 hours. No results for input(s): AMMONIA in the last 168 hours. CBC:  Recent Labs Lab 05/05/15 1235  WBC 5.4  NEUTROABS 3.3  HGB 13.8  HCT 41.7  MCV 91.9  PLT 235   Cardiac Enzymes: No results for input(s): CKTOTAL, CKMB, CKMBINDEX, TROPONINI in the last 168 hours.  BNP (last 3 results) No results for input(s): BNP in the last 8760 hours.  ProBNP (last 3 results) No results for input(s): PROBNP in the last 8760 hours.  CBG: No results for  input(s): GLUCAP in the last 168 hours.  Radiological Exams on Admission: Dg Chest 2 View  05/05/2015   CLINICAL DATA:  Pain following fall 2 days prior. Confusion. Hypertension.  EXAM: CHEST  2 VIEW  COMPARISON:  March 21, 2015  FINDINGS: There is no edema or consolidation. Heart is enlarged with pulmonary vascularity within normal limits. Aorta is tortuous and prominent but stable. No pneumothorax. No adenopathy. Bones are osteoporotic. Multiple compression fractures in the thoracic spine appear stable.  IMPRESSION: Multiple stable appearing fractures in the thoracic spine. Bones osteoporotic. Tortuous aorta is likely due to chronic hypertension and is stable. There is stable cardiomegaly. Lungs clear.   Electronically Signed   By: Bretta Bang III M.D.   On: 05/05/2015 12:54   Ct Head Wo Contrast  05/05/2015   CLINICAL DATA:  Pain following fall 2 days prior.  Confusion.  EXAM: CT HEAD WITHOUT CONTRAST  CT CERVICAL SPINE WITHOUT CONTRAST  TECHNIQUE: Multidetector CT imaging of the head and cervical spine was performed following the standard protocol without intravenous contrast. Multiplanar CT image  reconstructions of the cervical spine were also generated.  COMPARISON:  Head CT May 11, 2013  FINDINGS: CT HEAD FINDINGS  Moderate diffuse atrophy is stable. There is no intracranial mass, hemorrhage, extra-axial fluid collection, or midline shift. There is small vessel disease throughout the centra semiovale bilaterally. There is no new gray-white compartment lesion. Bones are osteoporotic. The bony calvarium appears intact. The mastoid air cells are clear. There is a small right frontal scalp hematoma.  CT CERVICAL SPINE FINDINGS  There is no demonstrable fracture or spondylolisthesis. Prevertebral soft tissues and predental space regions are normal.  There is ankylosis at C5 and C6. There is marked disc space narrowing at C6-7. There is mild disc space narrowing at C7-T1 and T1-2. There is facet  osteoarthritic change at all levels bilaterally. There is no frank disc extrusion or stenosis.  Bones are diffusely osteoporotic.  There is scarring and pleural thickening in each lung apex. Thyroid appears normal. There are multiple foci of carotid artery calcification bilaterally.  IMPRESSION: CT head: Stable atrophy with supratentorial small vessel disease. No acute infarct evident. No intracranial mass, hemorrhage, or extra-axial fluid collection. Bones osteoporotic. Small right frontal scalp hematoma.  CT cervical spine: Multilevel osteoarthritic change. Note that there is ankylosis at C5-6. No acute fracture or spondylolisthesis. Bones diffusely osteoporotic. Extensive carotid artery calcification noted bilaterally.   Electronically Signed   By: Bretta Bang III M.D.   On: 05/05/2015 13:15   Ct Cervical Spine Wo Contrast  05/05/2015   CLINICAL DATA:  Pain following fall 2 days prior.  Confusion.  EXAM: CT HEAD WITHOUT CONTRAST  CT CERVICAL SPINE WITHOUT CONTRAST  TECHNIQUE: Multidetector CT imaging of the head and cervical spine was performed following the standard protocol without intravenous contrast. Multiplanar CT image reconstructions of the cervical spine were also generated.  COMPARISON:  Head CT May 11, 2013  FINDINGS: CT HEAD FINDINGS  Moderate diffuse atrophy is stable. There is no intracranial mass, hemorrhage, extra-axial fluid collection, or midline shift. There is small vessel disease throughout the centra semiovale bilaterally. There is no new gray-white compartment lesion. Bones are osteoporotic. The bony calvarium appears intact. The mastoid air cells are clear. There is a small right frontal scalp hematoma.  CT CERVICAL SPINE FINDINGS  There is no demonstrable fracture or spondylolisthesis. Prevertebral soft tissues and predental space regions are normal.  There is ankylosis at C5 and C6. There is marked disc space narrowing at C6-7. There is mild disc space narrowing at C7-T1  and T1-2. There is facet osteoarthritic change at all levels bilaterally. There is no frank disc extrusion or stenosis.  Bones are diffusely osteoporotic.  There is scarring and pleural thickening in each lung apex. Thyroid appears normal. There are multiple foci of carotid artery calcification bilaterally.  IMPRESSION: CT head: Stable atrophy with supratentorial small vessel disease. No acute infarct evident. No intracranial mass, hemorrhage, or extra-axial fluid collection. Bones osteoporotic. Small right frontal scalp hematoma.  CT cervical spine: Multilevel osteoarthritic change. Note that there is ankylosis at C5-6. No acute fracture or spondylolisthesis. Bones diffusely osteoporotic. Extensive carotid artery calcification noted bilaterally.   Electronically Signed   By: Bretta Bang III M.D.   On: 05/05/2015 13:15    EKG: Independently reviewed. Atrial fibrillation, rate 85  Assessment/Plan  Altered mental status -Patient will be admitted for observation, to telemetry -CT head negative for acute infarct -Will conduct neuro checks -Will obtain MRI of the brain, if positive would obtain echocardiogram -will consult PT and  OT for evaluation and treatment -Continue aspirin -Unlikely due to infectious etiology, chest x-ray and UA negative for infection, patient currently afebrile with no leukocytosis.  Chronic lower extremity swelling -Continue Lasix -Will consult wound care, patient may benefit from [employer] boot  ? Cellulitis -Patient currently afebrile with no leukocytosis -Recently completed course of doxycycline -Patient was started on make a mycin and the emergency department, will continue  Atrial fibrillation -Continue aspirin therapy, not candidate for anticoagulation due to fall risk -Continue metoprolol for rate control  Hypertension -Continue metoprolol, Lasix  Parkinson's -Continue primidone  DVT prophylaxis: Lovenox  Code Status: DO NOT RESUSCITATE  Condition:  Guarded  Family Communication: Daughter-in-law at bedside Admission, patients condition and plan of care including tests being ordered have been discussed with the patient and daughter-in-law who indicate understanding and agree with the plan and Code Status.  Disposition Plan: Admitted for observation   Time spent: 50 minutes  Daking Westervelt D.O. [facility] Pager [phone]  If 7PM-7AM, please contact night-coverage [email or web address] Password [email or web address] 05/05/2015, 4:29 PM

## 2015-05-05 NOTE — ED Provider Notes (Signed)
CSN: [id number]     Arrival date & time 05/05/15  1150 History   None    Chief Complaint  Patient presents with  . Fall     (Consider location/radiation/quality/duration/timing/severity/associated sxs/prior Treatment) Patient is a 81 y.o. female presenting with fall. The history is provided by the patient. No language interpreter was used.  Fall Pertinent negatives include no abdominal pain or chest pain.  Alejandra Bailey is a 81 y.o female with a history of a-fib and parkinson who presents with confusion since this morning. Her daughter-in-law states that she slipped on Wednesday and hit her the right side of her head. She did not lose consciousness and was assisted by EMS but refused to go to the hospital. Today, her daughter-in-law spoke to her on the phone and was supposed to take her to the doctor for follow-up of her left lower leg cellulitis but that she seemed confused. She is not on any anticoagulation medication. She denies any pain. Past Medical History  Diagnosis Date  . Hypertension   . Tremor   . High cholesterol   . History of pelvic fracture   . History of measles   . History of mumps   . A-fib   . Parkinson disease    Past Surgical History  Procedure Laterality Date  . Tonsillectomy  1934  . Fracture pelvic bone  2011   Family History  Problem Relation Age of Onset  . Stroke Father   . Breast cancer Mother   . Gout Son   . Heart Problems Son     Triple Bypass  . Cancer Maternal Grandmother 81    breast cancer  . Cancer Maternal Grandfather 50    stomach cancer   Social History  Substance Use Topics  . Smoking status: Never Smoker   . Smokeless tobacco: Never Used  . Alcohol Use: No   OB History    No data available     Review of Systems  Respiratory: Negative for shortness of breath.   Cardiovascular: Negative for chest pain.  Gastrointestinal: Negative for abdominal pain.  All other systems reviewed and are negative.     Allergies  Review of  patient's allergies indicates no known allergies.  Home Medications   Prior to Admission medications   Medication Sig Start Date End Date Taking? Authorizing Provider  aspirin EC 325 MG tablet Take 1 tablet (325 mg total) by mouth daily. 11/03/13  Yes Mahima Pandey, MD  benazepril-hydrochlorthiazide (LOTENSIN HCT) 20-25 MG per tablet Take 1 tablet by mouth daily. 04/30/15  Yes Historical Provider, MD  Cholecalciferol (VITAMIN D-3 PO) Take 2,000 Units by mouth daily.    Yes Historical Provider, MD  doxycycline (VIBRA-TABS) 100 MG tablet Take 1 tablet (100 mg total) by mouth 2 (two) times daily. 04/24/15  Yes Elease Etienne, MD  furosemide (LASIX) 20 MG tablet Take 1 tablet (20 mg total) by mouth daily. 04/24/15  Yes Elease Etienne, MD  metoprolol tartrate (LOPRESSOR) 25 MG tablet Take 1 tablet (25 mg total) by mouth 2 (two) times daily. 03/21/15  Yes Blane Ohara, MD  Omega-3 Fatty Acids (FISH OIL PO) Take 1 capsule by mouth daily.   Yes Historical Provider, MD  primidone (MYSOLINE) 50 MG tablet Take two tablets by mouth once daily 01/19/15  Yes Sharon Seller, NP  traMADol (ULTRAM) 50 MG tablet Take 1 tablet (50 mg total) by mouth every 8 (eight) hours as needed for moderate pain or severe pain. Patient taking differently: Take  50 mg by mouth at bedtime.  04/24/15  Yes Elease Etienne, MD   BP 150/95 mmHg  Pulse 83  Temp(Src) 97.5 F (36.4 C) (Oral)  Resp 18  Ht 5\' 1"  (1.549 m)  Wt 105 lb 14.4 oz (48.036 kg)  BMI 20.02 kg/m2  SpO2 99% Physical Exam  Constitutional: She is oriented to person, place, and time. She appears well-developed and well-nourished.  HENT:  Head: Normocephalic. Head is with contusion. Head is without Battle's sign, without abrasion and without laceration. Hair is normal.    Right-sided facial hematoma as diagrammed. No deformity noted.  Eyes: Conjunctivae are normal.  Neck: Normal range of motion. Neck supple.  Cardiovascular: Normal rate, regular rhythm and  normal heart sounds.   Pulmonary/Chest: Effort normal and breath sounds normal.  Abdominal: Soft. There is no tenderness.  Musculoskeletal: Normal range of motion.  Bilateral lower leg pitting edema with small ulceration on the mid left anterior lower leg.  2+ DP pulses bilaterally.  Hips and pelvis are stable.  Neurological: She is alert and oriented to person, place, and time.  Skin: Skin is warm and dry.  Nursing note and vitals reviewed.   ED Course  Procedures (including critical care time) Labs Review Labs Reviewed  COMPREHENSIVE METABOLIC PANEL - Abnormal; Notable for the following:    Potassium 3.3 (*)    All other components within normal limits  URINALYSIS, ROUTINE W REFLEX MICROSCOPIC (NOT AT [facility]) - Abnormal; Notable for the following:    Color, Urine AMBER (*)    Bilirubin Urine SMALL (*)    Ketones, ur 15 (*)    Protein, ur 30 (*)    All other components within normal limits  URINE MICROSCOPIC-ADD ON - Abnormal; Notable for the following:    Casts HYALINE CASTS (*)    Crystals CA OXALATE CRYSTALS (*)    All other components within normal limits  CBC WITH DIFFERENTIAL/PLATELET  I-STAT TROPOININ, ED  I-STAT CG4 LACTIC ACID, ED    Imaging Review Dg Chest 2 View  05/05/2015   CLINICAL DATA:  Pain following fall 2 days prior. Confusion. Hypertension.  EXAM: CHEST  2 VIEW  COMPARISON:  March 21, 2015  FINDINGS: There is no edema or consolidation. Heart is enlarged with pulmonary vascularity within normal limits. Aorta is tortuous and prominent but stable. No pneumothorax. No adenopathy. Bones are osteoporotic. Multiple compression fractures in the thoracic spine appear stable.  IMPRESSION: Multiple stable appearing fractures in the thoracic spine. Bones osteoporotic. Tortuous aorta is likely due to chronic hypertension and is stable. There is stable cardiomegaly. Lungs clear.   Electronically Signed   By: Bretta Bang III M.D.   On: 05/05/2015 12:54   Ct Head Wo  Contrast  05/05/2015   CLINICAL DATA:  Pain following fall 2 days prior.  Confusion.  EXAM: CT HEAD WITHOUT CONTRAST  CT CERVICAL SPINE WITHOUT CONTRAST  TECHNIQUE: Multidetector CT imaging of the head and cervical spine was performed following the standard protocol without intravenous contrast. Multiplanar CT image reconstructions of the cervical spine were also generated.  COMPARISON:  Head CT May 11, 2013  FINDINGS: CT HEAD FINDINGS  Moderate diffuse atrophy is stable. There is no intracranial mass, hemorrhage, extra-axial fluid collection, or midline shift. There is small vessel disease throughout the centra semiovale bilaterally. There is no new gray-white compartment lesion. Bones are osteoporotic. The bony calvarium appears intact. The mastoid air cells are clear. There is a small right frontal scalp hematoma.  CT CERVICAL  SPINE FINDINGS  There is no demonstrable fracture or spondylolisthesis. Prevertebral soft tissues and predental space regions are normal.  There is ankylosis at C5 and C6. There is marked disc space narrowing at C6-7. There is mild disc space narrowing at C7-T1 and T1-2. There is facet osteoarthritic change at all levels bilaterally. There is no frank disc extrusion or stenosis.  Bones are diffusely osteoporotic.  There is scarring and pleural thickening in each lung apex. Thyroid appears normal. There are multiple foci of carotid artery calcification bilaterally.  IMPRESSION: CT head: Stable atrophy with supratentorial small vessel disease. No acute infarct evident. No intracranial mass, hemorrhage, or extra-axial fluid collection. Bones osteoporotic. Small right frontal scalp hematoma.  CT cervical spine: Multilevel osteoarthritic change. Note that there is ankylosis at C5-6. No acute fracture or spondylolisthesis. Bones diffusely osteoporotic. Extensive carotid artery calcification noted bilaterally.   Electronically Signed   By: Bretta Bang III M.D.   On: 05/05/2015 13:15    Ct Cervical Spine Wo Contrast  05/05/2015   CLINICAL DATA:  Pain following fall 2 days prior.  Confusion.  EXAM: CT HEAD WITHOUT CONTRAST  CT CERVICAL SPINE WITHOUT CONTRAST  TECHNIQUE: Multidetector CT imaging of the head and cervical spine was performed following the standard protocol without intravenous contrast. Multiplanar CT image reconstructions of the cervical spine were also generated.  COMPARISON:  Head CT May 11, 2013  FINDINGS: CT HEAD FINDINGS  Moderate diffuse atrophy is stable. There is no intracranial mass, hemorrhage, extra-axial fluid collection, or midline shift. There is small vessel disease throughout the centra semiovale bilaterally. There is no new gray-white compartment lesion. Bones are osteoporotic. The bony calvarium appears intact. The mastoid air cells are clear. There is a small right frontal scalp hematoma.  CT CERVICAL SPINE FINDINGS  There is no demonstrable fracture or spondylolisthesis. Prevertebral soft tissues and predental space regions are normal.  There is ankylosis at C5 and C6. There is marked disc space narrowing at C6-7. There is mild disc space narrowing at C7-T1 and T1-2. There is facet osteoarthritic change at all levels bilaterally. There is no frank disc extrusion or stenosis.  Bones are diffusely osteoporotic.  There is scarring and pleural thickening in each lung apex. Thyroid appears normal. There are multiple foci of carotid artery calcification bilaterally.  IMPRESSION: CT head: Stable atrophy with supratentorial small vessel disease. No acute infarct evident. No intracranial mass, hemorrhage, or extra-axial fluid collection. Bones osteoporotic. Small right frontal scalp hematoma.  CT cervical spine: Multilevel osteoarthritic change. Note that there is ankylosis at C5-6. No acute fracture or spondylolisthesis. Bones diffusely osteoporotic. Extensive carotid artery calcification noted bilaterally.   Electronically Signed   By: Bretta Bang III M.D.    On: 05/05/2015 13:15   I have personally reviewed and evaluated these images and lab results as part of my medical decision-making.   EKG Interpretation   Date/Time:  Friday May 05 2015 11:57:55 EDT Ventricular Rate:  85 PR Interval:    QRS Duration: 99 QT Interval:  372 QTC Calculation: 442 R Axis:   31 Text Interpretation:  Atrial fibrillation RSR' in V1 or V2, probably  normal variant Probable LVH with secondary repol abnrm No significant  change since last tracing Confirmed by YAO  MD, DAVID ([id number]) on 05/05/2015  12:04:35 PM      MDM   Final diagnoses:  Altered mental status, unspecified altered mental status type  Cellulitis of left lower extremity  Patient presents for ams after head injury 2  days ago.  Her vitals are stable and she is hemodynamically stable. CT head and cervical spine shows no acute abnormalities. Chest x-ray is negative for edema or pneumonia. Labs are not concerning. Urine is negative for UTI. Lactic acid is normal.  She has bilateral lower extremity pitting edema and cellulitis. She also has AMS but has return to near baseline. I discussed this patient with the hospitalist who will place the patient in observation.     Alejandra Gosselin, PA-C 05/05/15 1644  Richardean Canal, MD 05/07/15 [phone]

## 2015-05-05 NOTE — Telephone Encounter (Signed)
Noted.  I have seen this patient only once and my recommendations for care were not followed in terms of her pain management.  Certainly if she was slumped over, confused and nearly unresponsive, she needed to be seen in the ED.  I'm not sure how they could transport her here in that conditions safely.

## 2015-05-05 NOTE — Telephone Encounter (Signed)
Renee, daughter in law, called and stated that patient has an appointment with Dr. Renato Gails at 11:00 today and she is at patient's home now and she found patient slumped over in chair and confused. She finally got patient up and is moving very slowly, very confused. I asked her to hold and I went to Dr. Renato Gails and asked her opinion and she suggested patient to go to the hospital because it sounded like she is going to have to send her anyway but if she still insisted on bringing her in to tell her to come on. I tried to informed the daughter in law of this and she started yelling in the phone before I could complete my sentence stating that if we did our job and got her in before now she wouldn't be like this. I could not speak because she kept yelling. She stated that she was going to call the medical board and report [place] about the "kind of business we were running." And she hung up on me. I spoke with Kathie Rhodes and she stated that daughter in law was offered a sooner appointment at 8:00 in the morning a couple days ago and she refused it because it was too early in the morning so she took today's appointment at 11:00. I told Kathie Rhodes if she called back she can still bring patient in if she didn't take her to the hospital, I was trying to tell daughter in law but she kept cutting me off and hung up on me.

## 2015-05-05 NOTE — Care Management Note (Addendum)
Case Management Note  Patient Details  Name: Alejandra Bailey MRN: [id number] Date of Birth: 1922-08-29  Subjective/Objective:                  81 y.o. female presenting with fall.//From Independent Living ([facility][phone])- Daughter-in-law at bedside.  Action/Plan: Follow for disposition needs  Expected Discharge Date:          05/06/15        Expected Discharge Plan:  Home w Home Health Services  In-House Referral:     Discharge planning Services  CM Consult  Post Acute Care Choice:  Home Health Choice offered to:  [facility] POA / Guardian  DME Arranged:    DME Agency:     HH Arranged:    HH Agency:     Status of Service:  In process, will continue to follow  Medicare Important Message Given:    Date Medicare IM Given:    Medicare IM give by:    Date Additional Medicare IM Given:    Additional Medicare Important Message give by:     If discussed at Long Length of Stay Meetings, dates discussed:    Additional Comments: NCM consulted to assist with HH. Spoke with pt and daughter-in-law at bedside.  Both are unsure of HH agency.  NCM will research.  In the meantime, list of HH agencies and Private duty agencies presented to daughter-in-law.  NCM will follow pt throughout hospital stay and update plan as needed.  Oletta Cohn, RN 05/05/2015, 3:21 PM

## 2015-05-06 DIAGNOSIS — R609 Edema, unspecified: Secondary | ICD-10-CM | POA: Diagnosis not present

## 2015-05-06 DIAGNOSIS — R4182 Altered mental status, unspecified: Secondary | ICD-10-CM | POA: Diagnosis not present

## 2015-05-06 DIAGNOSIS — I482 Chronic atrial fibrillation: Secondary | ICD-10-CM | POA: Diagnosis not present

## 2015-05-06 DIAGNOSIS — L0390 Cellulitis, unspecified: Secondary | ICD-10-CM | POA: Diagnosis not present

## 2015-05-06 LAB — BASIC METABOLIC PANEL
Anion gap: 6 (ref 5–15)
BUN: 11 mg/dL (ref 6–20)
CALCIUM: 9.5 mg/dL (ref 8.9–10.3)
CO2: 28 mmol/L (ref 22–32)
CREATININE: 0.73 mg/dL (ref 0.44–1.00)
Chloride: 103 mmol/L (ref 101–111)
GFR calc Af Amer: >60 mL/min (ref >60)
GLUCOSE: 98 mg/dL (ref 65–99)
Potassium: 3.9 mmol/L (ref 3.5–5.1)
SODIUM: 137 mmol/L (ref 135–145)

## 2015-05-06 MED ORDER — SACCHAROMYCES BOULARDII 250 MG PO CAPS
250.0000 mg | ORAL_CAPSULE | Freq: Two times a day (BID) | ORAL | Status: DC
  Administered 2015-05-06 – 2015-05-07 (×3): 250 mg via ORAL
  Filled 2015-05-06 (×3): qty 1

## 2015-05-06 MED ORDER — HYDROCHLOROTHIAZIDE 25 MG PO TABS
25.0000 mg | ORAL_TABLET | Freq: Every day | ORAL | Status: DC
  Administered 2015-05-06: 25 mg via ORAL
  Filled 2015-05-06 (×2): qty 1

## 2015-05-06 MED ORDER — BENAZEPRIL-HYDROCHLOROTHIAZIDE 20-25 MG PO TABS: 1.0000 | ORAL_TABLET | Freq: Every day | ORAL | Status: DC

## 2015-05-06 MED ORDER — BENAZEPRIL HCL 20 MG PO TABS
20.0000 mg | ORAL_TABLET | Freq: Every day | ORAL | Status: DC
  Administered 2015-05-06: 20 mg via ORAL
  Filled 2015-05-06 (×2): qty 1

## 2015-05-06 MED ORDER — SODIUM CHLORIDE 0.9 % IV SOLN
500.0000 mg | INTRAVENOUS | Status: DC
  Administered 2015-05-06: 500 mg via INTRAVENOUS
  Filled 2015-05-06 (×2): qty 500

## 2015-05-06 NOTE — Plan of Care (Signed)
Problem: Phase I Progression Outcomes Goal: Initial discharge plan identified Outcome: Completed/Met Date Met:  05/06/15 To return home with H/H

## 2015-05-06 NOTE — Social Work (Signed)
CSW spoke with patient's daughter in law at length about care plan for patient.  Concern for HH vs SNF was due to patient's safety due to AMS when DIL found patient in her apartment. Patient currently has PT in place. This CSW recommends adding HHRN for concerns about medication management in the home and HHSW for additional assessment of safety in the home. Patient also has medical alert bracelet and supports in her Independent Living Facility Building surveyor) to support safety in the home. DIL also stated that she would follow up with new PCP for patient in order to get further assessment of needs of patient in reflection of this acute period of AMS. MD aware and in agreement. MD will consult RNCM.   Patient's son and daughter in law willing to provide patient transport back home.   No further CSW needs.  CSW signing off.  Beverly Sessions MSW, LCSW [phone]

## 2015-05-06 NOTE — Progress Notes (Signed)
Physical Therapy Evaluation Patient Details Name: Alejandra Bailey MRN: [id number] DOB: 25-May-1923 Today's Date: 05/06/2015   History of Present Illness  Alejandra Bailey is a 81 y.o. female with recent fall, negative on CT, and AMS,with history of LE edema and cellulitis; PMHx: thoracic compression fx, parkinson's, afib  Clinical Impression  Patient reports she is feeling better again, still demonstrates mild instability and requiring MIN assist for basic mobility due to increased fall risk.  Patient will benefit from continued PT services for strengthening, balance and mobility, and given recent history, recommending SNF vs Home Health at this time.  Discussed options and recommendations with patient, family not present during evaluation.     Follow Up Recommendations SNF ([facility] vs home with Home Health)    Equipment Recommendations  None recommended by PT    Recommendations for Other Services       Precautions / Restrictions Precautions Precautions: Fall Restrictions Weight Bearing Restrictions: No      Mobility  Bed Mobility Overal bed mobility: Needs Assistance Bed Mobility: Supine to Sit     Supine to sit: Min assist     General bed mobility comments: Increased time, assist to scoot to edge of bed.  Transfers Overall transfer level: Needs assistance Equipment used: Rolling walker (2 wheeled) Transfers: Sit to/from [employer] Sit to Stand: Min guard Stand pivot transfers: Min guard       General transfer comment: cues for safety  Ambulation/Gait Ambulation/Gait assistance: Min guard Ambulation Distance (Feet): 40 Feet Assistive device: Rolling walker (2 wheeled) Gait Pattern/deviations: Decreased stride length;Shuffle;Trunk flexed;Narrow base of support   Gait velocity interpretation: <1.8 ft/sec, indicative of risk for recurrent falls General Gait Details: cues for posture, RW safety, patient defered walking further distance  today  Stairs            Wheelchair Mobility    Modified Rankin (Stroke Patients Only)       Balance Overall balance assessment: Needs assistance   Sitting balance-Leahy Scale: Good       Standing balance-Leahy Scale: Fair                               Pertinent Vitals/Pain Pain Assessment: No/denies pain    Home Living Family/patient expects to be discharged to:: Other (Comment) ([facility] [place] [facility]?)) Living Arrangements: Alone   Type of Home: Apartment       Home Layout: One level Home Equipment: Walker - 2 wheels      Prior Function Level of Independence: Independent with assistive device(s)         Comments: per pt she amb with RW      Hand Dominance        Extremity/Trunk Assessment   Upper Extremity Assessment: Overall WFL for tasks assessed;Defer to OT evaluation           Lower Extremity Assessment: Overall WFL for tasks assessed;Generalized weakness      Cervical / Trunk Assessment: Kyphotic  Communication   Communication: No difficulties  Cognition Arousal/Alertness: Awake/alert Behavior During Therapy: WFL for tasks assessed/performed Overall Cognitive Status: No family/caregiver present to determine baseline cognitive functioning Area of Impairment: Problem solving;Safety/judgement     Memory: Decreased short-term memory   Safety/Judgement: Decreased awareness of safety   Problem Solving: Requires verbal cues      General Comments      Exercises        Assessment/Plan    PT  Assessment Patient needs continued PT services  PT Diagnosis Difficulty walking   PT Problem List Decreased strength;Decreased balance;Decreased mobility;Decreased safety awareness  PT Treatment Interventions Gait training;Functional mobility training;Therapeutic activities;Therapeutic exercise;Balance training;Patient/family education   PT Goals (Current goals can be found in the Care Plan section) Acute Rehab PT  Goals Patient Stated Goal: To go home, states it may be a good idea to go to rehab place first PT Goal Formulation: With patient Time For Goal Achievement: 05/20/15 Potential to Achieve Goals: Good    Frequency Min 3X/week   Barriers to discharge        Co-evaluation               End of Session Equipment Utilized During Treatment: Gait belt Activity Tolerance: Patient tolerated treatment well Patient left: in chair;with call bell/phone within reach Nurse Communication: Mobility status    Functional Assessment Tool Used: Clinical judgement Functional Limitation: Mobility: Walking and moving around Mobility: Walking and Moving Around Current Status [phone]): At least 20 percent but less than 40 percent impaired, limited or restricted Mobility: Walking and Moving Around Goal Status [phone]): At least 1 percent but less than 20 percent impaired, limited or restricted    Time: 0930-1005 PT Time Calculation (min) (ACUTE ONLY): 35 min   Charges:   PT Evaluation $Initial PT Evaluation Tier I: 1 Procedure PT Treatments $Therapeutic Activity: 8-22 mins   PT G Codes:   PT G-Codes **NOT FOR INPATIENT CLASS** Functional Assessment Tool Used: Clinical judgement Functional Limitation: Mobility: Walking and moving around Mobility: Walking and Moving Around Current Status (U9811): At least 20 percent but less than 40 percent impaired, limited or restricted Mobility: Walking and Moving Around Goal Status [phone]): At least 1 percent but less than 20 percent impaired, limited or restricted    Freida Busman, Lyndie Vanderloop L 05/06/2015, 10:12 AM

## 2015-05-06 NOTE — Progress Notes (Signed)
ANTIBIOTIC CONSULT NOTE - INITIAL  Pharmacy Consult for Vancomycin Indication: cellulitis  No Known Allergies  Patient Measurements: Height: 5' (152.4 cm) Weight: 106 lb 4.8 oz (48.217 kg) IBW/kg (Calculated) : 45.5   Vital Signs: Temp: 98.8 F (37.1 C) (09/10 1133) Temp Source: Oral (09/10 0712) BP: 112/79 mmHg (09/10 1133) Pulse Rate: 98 (09/10 1133) Intake/Output from previous day: 09/09 0701 - 09/10 0700 In: -  Out: 50 [Urine:50] Intake/Output from this shift: Total I/O In: 0  Out: 400 [Urine:400]  Labs:  Recent Labs  05/05/15 1235 05/06/15 1135  WBC 5.4  --   HGB 13.8  --   PLT 235  --   CREATININE 0.80 0.73   Estimated Creatinine Clearance: 33.6 mL/min (by C-G formula based on Cr of 0.73). No results for input(s): VANCOTROUGH, VANCOPEAK, VANCORANDOM, GENTTROUGH, GENTPEAK, GENTRANDOM, TOBRATROUGH, TOBRAPEAK, TOBRARND, AMIKACINPEAK, AMIKACINTROU, AMIKACIN in the last 72 hours.   Microbiology: No results found for this or any previous visit (from the past 720 hour(s)).  Medical History: Past Medical History  Diagnosis Date  . Hypertension   . Tremor   . High cholesterol   . History of pelvic fracture 2011    "no OR"  . History of measles   . History of mumps   . A-fib   . Parkinson disease     pt denies this hx on 05/05/2015  . Family history of adverse reaction to anesthesia   . Arthritis     "thumbs" (05/05/2015)  . Chronic back pain greater than 3 months duration     Medications:  Scheduled:  .  stroke: mapping our early stages of recovery book   Does not apply Once  . aspirin EC  325 mg Oral Daily  . benazepril  20 mg Oral Daily  . cholecalciferol  2,000 Units Oral Daily  . enoxaparin (LOVENOX) injection  30 mg Subcutaneous Q24H  . furosemide  20 mg Oral Daily  . hydrochlorothiazide  25 mg Oral Daily  . metoprolol tartrate  25 mg Oral BID  . omega-3 acid ethyl esters  1 g Oral Daily  . potassium chloride  40 mEq Oral Daily  . primidone   50 mg Oral QHS  . saccharomyces boulardii  250 mg Oral BID  . vancomycin  500 mg Intravenous Q24H   Assessment: 81 YO female admitted 9/9 with AMS. Recently admitted 8/26 for cellulitis.  Received 3 days vancomycin during that admission. She was discharged and completed a 7 day course of doxycycline.  She received vanc 1 g in the ED on 9/9 @ 1450.  SCr. 0.73, WBC wnl, afebrile.   Goal of Therapy:  Vancomycin trough level 10-15 mcg/ml  Plan:  Give vancomycin 500 mg IV q24h Monitor renal function, clinical progession, [place] at [facility], PharmD Clinical Pharmacy Resident Pager # [phone] 05/06/2015 1:48 PM

## 2015-05-06 NOTE — Progress Notes (Signed)
[facility]                                                                              Patient Demographics  Alejandra Bailey, is a 81 y.o. female, DOB - 10-16-1922, ZOX:[id number]  Admit date - 05/05/2015   Admitting Physician Edsel Petrin, DO  Outpatient Primary MD for the patient is REED, TIFFANY, DO  L[facility] -    Chief Complaint  Patient presents with  . Fall      HPI on 05/05/2015  Alejandra Bailey is a 81 y.o. female  With a history of lower extremity swelling, recent hospitalization for cellulitis, hypertension, atrial fibrillation, that presented to the emergency department with altered mental status. Per her daughter-in-law at bedside, patient has been speaking differently and nonsensical over the past 2 days. Patient has been more forgetful over the past month. Patient was recently admitted for cellulitis and completed course of doxycycline. Patient was to go to her primary care physician for follow-up of her cellulitis today however when daughter-in-law spoke to her on the phone she seemed different. When she went to the patient's house, she found her slumped over. Patient admits to falling a couple of days ago. In the emergency department, patient had a CT of the head which was negative. [facility] was called for admission. Upon questioning, patient sternal states that the patient is currently back at her baseline.  Assessment & Plan   Altered mental status -Resolved -CT head negative for acute infarct -MRI of brain: No acute intracranial process identified -PT consult recommended SNF -Continue aspirin -Unlikely due to infectious etiology, chest x-ray and UA negative for infection, patient currently afebrile with no leukocytosis.   Chronic lower extremity swelling -Continue Lasix -Edema improving. Patient needs to continue maintaining elevation of the lower extremities. -Will consult wound care, patient may benefit from Unna boot  Transient hypoxia -Patient noted to  have saturations dropped to 88% however patient rebounds without any oxygen supplementation to upper 90s -Continue to monitor   ? Cellulitis -Patient currently afebrile with no leukocytosis -Recently completed course of doxycycline -Continue vancomycin -Question whether this was truly cellulitis versus chronic changes from LE edema  Atrial fibrillation -Continue aspirin therapy, not candidate for anticoagulation due to fall risk -Continue metoprolol  -Patient's heart rated noted to be in the 120s, but possible due to tremor -Continue to monitor   Hypertension -Continue metoprolol, Lasix -BP elevated this morning, will benazepril/HCTZ  Parkinson's -Continue primidone  Physical deconditioning and falls -Patient recently fell at home.  PT consulted, recommended SNF. -Pending OT consult -Social work consulted   Code Status: DNR  Family Communication: Daughter via phone  Disposition Plan: Admitted.  Pending SNF  Time Spent in minutes   30 minutes  Procedures  None  Consults   None  DVT Prophylaxis  Lovenox  Lab Results  Component Value Date   PLT 235 05/05/2015    Medications  Scheduled Meds: .  stroke: mapping our early stages of recovery book   Does not apply Once  . aspirin EC  325 mg Oral Daily  . benazepril  20 mg Oral Daily  . cholecalciferol  2,000 Units Oral Daily  . enoxaparin (LOVENOX)  injection  30 mg Subcutaneous Q24H  . furosemide  20 mg Oral Daily  . hydrochlorothiazide  25 mg Oral Daily  . metoprolol tartrate  25 mg Oral BID  . omega-3 acid ethyl esters  1 g Oral Daily  . potassium chloride  40 mEq Oral Daily  . primidone  50 mg Oral QHS  . saccharomyces boulardii  250 mg Oral BID   Continuous Infusions:  PRN Meds:.senna-docusate  Antibiotics    Anti-infectives    Start     Dose/Rate Route Frequency Ordered Stop   05/05/15 1415  vancomycin (VANCOCIN) IVPB 1000 mg/200 mL premix     1,000 mg 200 mL/hr over 60 Minutes Intravenous  Once  05/05/15 1401 05/05/15 1732      Subjective:   Amariya Blevins seen and examined today.  Patient has no complaints today. Feels that her edema has improved slightly. Denies any chest pain, shortness of breath, abdominal pain, dizziness or headache.  Objective:   Filed Vitals:   05/05/15 1942 05/05/15 2136 05/06/15 0712 05/06/15 1043  BP:  112/63 152/98 158/140  Pulse: 86 70 94 121  Temp:  98.2 F (36.8 C) 97.9 F (36.6 C)   TempSrc:  Oral Oral   Resp:  18 20   Height:      Weight:      SpO2: 99% 98% 95%     Wt Readings from Last 3 Encounters:  05/05/15 48.217 kg (106 lb 4.8 oz)  04/21/15 50.3 kg (110 lb 14.3 oz)  03/23/15 50.349 kg (111 lb)     Intake/Output Summary (Last 24 hours) at 05/06/15 1125 Last data filed at 05/06/15 0843  Gross per 24 hour  Intake      0 ml  Output    450 ml  Net   -450 ml    Exam  General: Well developed, frail, no distress  HEENT: [place], ecchymosis/bruising above right eye, mucous membranes moist.   Cardiovascular: S1 S2 auscultated, irregularly irregular  Respiratory: Clear to auscultation bilaterally   Abdomen: Soft, nontender, nondistended, + bowel sounds  Extremities: warm dry without cyanosis clubbing. +2 pitting edema in LE B/L (improving)  Neuro: AAOx3, nonfocal  Skin: Lower extremity erythema and edema, small scab on right lower extremity  Psych: Appropriate mood and affect, pleasant  Data Review   Micro Results No results found for this or any previous visit (from the past 240 hour(s)).  Radiology Reports Dg Chest 2 View  05/05/2015   CLINICAL DATA:  Pain following fall 2 days prior. Confusion. Hypertension.  EXAM: CHEST  2 VIEW  COMPARISON:  March 21, 2015  FINDINGS: There is no edema or consolidation. Heart is enlarged with pulmonary vascularity within normal limits. Aorta is tortuous and prominent but stable. No pneumothorax. No adenopathy. Bones are osteoporotic. Multiple compression fractures in the thoracic spine  appear stable.  IMPRESSION: Multiple stable appearing fractures in the thoracic spine. Bones osteoporotic. Tortuous aorta is likely due to chronic hypertension and is stable. There is stable cardiomegaly. Lungs clear.   Electronically Signed   By: Bretta Bang III M.D.   On: 05/05/2015 12:54   Dg Tibia/fibula Left  04/21/2015   CLINICAL DATA:  Per EMS, pt sent from urgent care for cellulitis. Pt complains of redness, edema to her bilateral lower ankles. Pt has quarter sized wound to left lower anterior extremity with pain. Pt unable to recall any injury.  EXAM: LEFT TIBIA AND FIBULA - 2 VIEW  COMPARISON:  None.  FINDINGS: No fracture  or bone lesion. Bones are demineralized. Ankle and knee joints are normally aligned.  There is diffuse subcutaneous soft tissue edema. There is a small focal soft tissue defect along the anterior aspect of the lower leg. There is no radiopaque foreign body. No soft tissue air.  There is no area of bone resorption to suggest osteomyelitis.  IMPRESSION: No fracture or dislocation. No bone lesion. No evidence of osteomyelitis. No soft tissue air. Small soft tissue wound to the anterior lower leg.   Electronically Signed   By: Amie Portland M.D.   On: 04/21/2015 19:56   Ct Head Wo Contrast  05/05/2015   CLINICAL DATA:  Pain following fall 2 days prior.  Confusion.  EXAM: CT HEAD WITHOUT CONTRAST  CT CERVICAL SPINE WITHOUT CONTRAST  TECHNIQUE: Multidetector CT imaging of the head and cervical spine was performed following the standard protocol without intravenous contrast. Multiplanar CT image reconstructions of the cervical spine were also generated.  COMPARISON:  Head CT May 11, 2013  FINDINGS: CT HEAD FINDINGS  Moderate diffuse atrophy is stable. There is no intracranial mass, hemorrhage, extra-axial fluid collection, or midline shift. There is small vessel disease throughout the centra semiovale bilaterally. There is no new gray-white compartment lesion. Bones are  osteoporotic. The bony calvarium appears intact. The mastoid air cells are clear. There is a small right frontal scalp hematoma.  CT CERVICAL SPINE FINDINGS  There is no demonstrable fracture or spondylolisthesis. Prevertebral soft tissues and predental space regions are normal.  There is ankylosis at C5 and C6. There is marked disc space narrowing at C6-7. There is mild disc space narrowing at C7-T1 and T1-2. There is facet osteoarthritic change at all levels bilaterally. There is no frank disc extrusion or stenosis.  Bones are diffusely osteoporotic.  There is scarring and pleural thickening in each lung apex. Thyroid appears normal. There are multiple foci of carotid artery calcification bilaterally.  IMPRESSION: CT head: Stable atrophy with supratentorial small vessel disease. No acute infarct evident. No intracranial mass, hemorrhage, or extra-axial fluid collection. Bones osteoporotic. Small right frontal scalp hematoma.  CT cervical spine: Multilevel osteoarthritic change. Note that there is ankylosis at C5-6. No acute fracture or spondylolisthesis. Bones diffusely osteoporotic. Extensive carotid artery calcification noted bilaterally.   Electronically Signed   By: Bretta Bang III M.D.   On: 05/05/2015 13:15   Ct Cervical Spine Wo Contrast  05/05/2015   CLINICAL DATA:  Pain following fall 2 days prior.  Confusion.  EXAM: CT HEAD WITHOUT CONTRAST  CT CERVICAL SPINE WITHOUT CONTRAST  TECHNIQUE: Multidetector CT imaging of the head and cervical spine was performed following the standard protocol without intravenous contrast. Multiplanar CT image reconstructions of the cervical spine were also generated.  COMPARISON:  Head CT May 11, 2013  FINDINGS: CT HEAD FINDINGS  Moderate diffuse atrophy is stable. There is no intracranial mass, hemorrhage, extra-axial fluid collection, or midline shift. There is small vessel disease throughout the centra semiovale bilaterally. There is no new gray-white  compartment lesion. Bones are osteoporotic. The bony calvarium appears intact. The mastoid air cells are clear. There is a small right frontal scalp hematoma.  CT CERVICAL SPINE FINDINGS  There is no demonstrable fracture or spondylolisthesis. Prevertebral soft tissues and predental space regions are normal.  There is ankylosis at C5 and C6. There is marked disc space narrowing at C6-7. There is mild disc space narrowing at C7-T1 and T1-2. There is facet osteoarthritic change at all levels bilaterally. There is no frank  disc extrusion or stenosis.  Bones are diffusely osteoporotic.  There is scarring and pleural thickening in each lung apex. Thyroid appears normal. There are multiple foci of carotid artery calcification bilaterally.  IMPRESSION: CT head: Stable atrophy with supratentorial small vessel disease. No acute infarct evident. No intracranial mass, hemorrhage, or extra-axial fluid collection. Bones osteoporotic. Small right frontal scalp hematoma.  CT cervical spine: Multilevel osteoarthritic change. Note that there is ankylosis at C5-6. No acute fracture or spondylolisthesis. Bones diffusely osteoporotic. Extensive carotid artery calcification noted bilaterally.   Electronically Signed   By: Bretta Bang III M.D.   On: 05/05/2015 13:15   Mr Brain Wo Contrast  05/06/2015   CLINICAL DATA:  Initial evaluation for acute altered mental status.  EXAM: MRI HEAD WITHOUT CONTRAST  TECHNIQUE: Multiplanar, multiecho pulse sequences of the brain and surrounding structures were obtained without intravenous contrast.  COMPARISON:  Prior CT from earlier the same day.  FINDINGS: Diffuse prominence of the CSF containing spaces is compatible with generalized cerebral atrophy. Advanced chronic microvascular ischemic changes present within the periventricular and deep white matter both cerebral hemispheres. Scattered chronic micro hemorrhages present within the bilateral basal ganglia and thalamus, likely related to  chronic underlying hypertension. Few additional scattered chronic micro hemorrhages present within the bilateral occipital regions as well as the periventricular white matter. Susceptibility artifact present within the bilateral dentate nuclei.  No abnormal foci of restricted diffusion to suggest acute intracranial infarct. Normal intravascular flow voids maintained. Gray-white matter differentiation maintained. No acute intracranial hemorrhage.  No mass lesion, midline shift or mass effect. No hydrocephalus. No extra-axial fluid collection.  Craniocervical junction within normal limits. Scattered multilevel degenerative changes noted within the visualized upper cervical spine. Pituitary gland normal.  No acute abnormality about the orbits. Sequela prior bilateral lens extraction noted.  Bilateral frontal sinus disease noted, left greater than right. Scattered mucosal thickening within the ethmoidal air cells. No mastoid effusion. Inner ear structures grossly normal.  Bone marrow signal intensity within normal limits. Scalp soft tissues unremarkable.  IMPRESSION: 1. No acute intracranial process identified. 2. Generalized cerebral atrophy with moderate chronic microvascular ischemic disease. 3. Multiple subcentimeter chronic micro hemorrhages primarily centered about the bilateral thalami and basal ganglia, likely related to chronic underlying hypertension. 4. Bilateral frontal sinus disease, left greater than right.   Electronically Signed   By: Rise Mu M.D.   On: 05/06/2015 00:23    CBC  Recent Labs Lab 05/05/15 1235  WBC 5.4  HGB 13.8  HCT 41.7  PLT 235  MCV 91.9  MCH 30.4  MCHC 33.1  RDW 15.0  LYMPHSABS 1.4  MONOABS 0.6  EOSABS 0.1  BASOSABS 0.0    Chemistries   Recent Labs Lab 05/05/15 1235  NA 139  K 3.3*  CL 104  CO2 27  GLUCOSE 97  BUN 18  CREATININE 0.80  CALCIUM 10.3  AST 27  ALT 19  ALKPHOS 56  BILITOT 1.2    ------------------------------------------------------------------------------------------------------------------ estimated creatinine clearance is 33.6 mL/min (by C-G formula based on Cr of 0.8). ------------------------------------------------------------------------------------------------------------------ No results for input(s): HGBA1C in the last 72 hours. ------------------------------------------------------------------------------------------------------------------ No results for input(s): CHOL, HDL, LDLCALC, TRIG, CHOLHDL, LDLDIRECT in the last 72 hours. ------------------------------------------------------------------------------------------------------------------  Recent Labs  05/05/15 2141  TSH 1.263   ------------------------------------------------------------------------------------------------------------------ No results for input(s): VITAMINB12, FOLATE, FERRITIN, TIBC, IRON, RETICCTPCT in the last 72 hours.  Coagulation profile No results for input(s): INR, PROTIME in the last 168 hours.  No results for input(s): DDIMER in the  last 72 hours.  Cardiac Enzymes No results for input(s): CKMB, TROPONINI, MYOGLOBIN in the last 168 hours.  Invalid input(s): CK ------------------------------------------------------------------------------------------------------------------ Invalid input(s): POCBNP    Yailen Zemaitis D.O. on 05/06/2015 at 11:25 AM  Between 7am to 7pm - Pager - [phone]  After 7pm go to [email or web address] - password [email or web address]  And look for the night coverage person covering for me after hours  [facility] Office  [phone]

## 2015-05-07 DIAGNOSIS — R609 Edema, unspecified: Secondary | ICD-10-CM | POA: Diagnosis not present

## 2015-05-07 DIAGNOSIS — L0390 Cellulitis, unspecified: Secondary | ICD-10-CM | POA: Diagnosis not present

## 2015-05-07 DIAGNOSIS — R4182 Altered mental status, unspecified: Secondary | ICD-10-CM | POA: Diagnosis not present

## 2015-05-07 DIAGNOSIS — I482 Chronic atrial fibrillation: Secondary | ICD-10-CM | POA: Diagnosis not present

## 2015-05-07 MED ORDER — DOXYCYCLINE HYCLATE 100 MG PO TABS: 100.0000 mg | ORAL_TABLET | Freq: Two times a day (BID) | ORAL | Status: DC

## 2015-05-07 MED ORDER — METOPROLOL TARTRATE 25 MG PO TABS: 12.5000 mg | ORAL_TABLET | Freq: Two times a day (BID) | ORAL | Status: DC

## 2015-05-07 MED ORDER — BENAZEPRIL-HYDROCHLOROTHIAZIDE 20-25 MG PO TABS: 0.5000 | ORAL_TABLET | Freq: Every day | ORAL | Status: DC

## 2015-05-07 MED ORDER — SACCHAROMYCES BOULARDII 250 MG PO CAPS: 250.0000 mg | ORAL_CAPSULE | Freq: Two times a day (BID) | ORAL | Status: AC

## 2015-05-07 NOTE — Discharge Summary (Signed)
Physician Discharge Summary  [name] ZOX:[id number] DOB: June 28, 1923 DOA: 05/05/2015  PCP: Bufford Spikes, DO  Admit date: 05/05/2015 Discharge date: 05/07/2015  Time spent: 45 minutes  Recommendations for Outpatient Follow-up:  Patient will be discharged to home with home health PT, SW, RN.  Patient will need to follow up with primary care provider within one week of discharge, repeat BMP and blood pressure managament..  Patient should continue medications as prescribed.  Patient should follow a heart healthy diet.   Discharge Diagnoses:  Principal Problem:   Altered mental state Active Problems:   Essential hypertension, benign   A-fib   Edema   Cellulitis   Hypokalemia   Discharge Condition: Stable  Diet recommendation: heart healthy  Filed Weights   05/05/15 1159 05/05/15 1937  Weight: 48.036 kg (105 lb 14.4 oz) 48.217 kg (106 lb 4.8 oz)    History of present illness:  on 05/05/2015  Alejandra Bailey is a 81 y.o. female  With a history of lower extremity swelling, recent hospitalization for cellulitis, hypertension, atrial fibrillation, that presented to the emergency department with altered mental status. Per her daughter-in-law at bedside, patient has been speaking differently and nonsensical over the past 2 days. Patient has been more forgetful over the past month. Patient was recently admitted for cellulitis and completed course of doxycycline. Patient was to go to her primary care physician for follow-up of her cellulitis today however when daughter-in-law spoke to her on the phone she seemed different. When she went to the patient's house, she found her slumped over. Patient admits to falling a couple of days ago. In the emergency department, patient had a CT of the head which was negative. TRH was called for admission. Upon questioning, patient sternal states that the patient is currently back at her baseline.  Hospital Course:  Altered mental  status -Resolved -CT head negative for acute infarct -MRI of brain: No acute intracranial process identified -PT consult recommended SNF -Continue aspirin -Unlikely due to infectious etiology, chest x-ray and UA negative for infection, patient currently afebrile with no leukocytosis.   Chronic lower extremity swelling -Continue Lasix -Edema improving. Patient needs to continue maintaining elevation of the lower extremities. -HHRN for wound care  Transient hypoxia -Patient noted to have saturations dropped to 88% however patient rebounds without any oxygen supplementation to upper 90s -Continue to monitor   ? Cellulitis -Patient currently afebrile with no leukocytosis -Recently completed course of doxycycline- will discharge with additional 8 days of doxycycline -was initially placed on vancomycin -Question whether this was truly cellulitis versus chronic changes from LE edema  Atrial fibrillation -Continue aspirin therapy, not candidate for anticoagulation due to fall risk -Continue metoprolol  -Patient's heart rated noted to be in the 120s, but possible due to tremor -Continue to monitor   Hypertension -Continue metoprolol, Lasix -Continue home meds, however dosages reduced.    Parkinson's -Continue primidone  Physical deconditioning and falls -Patient recently fell at home. PT consulted, recommended SNF or HH -Social work consulted  -After discussion with family, they would like the patient to be discharged to home with home health and eventually will transition her to assisted living facility. -Will discharge patient with home health social worker as well  Hypokalemia -Resolved, likely secondary to diuretics  Code Status: DNR  Procedures  None  Consults  None  Discharge Exam: Filed Vitals:   05/07/15 0924  BP: 99/62  Pulse: 77  Temp: 98.1 F (36.7 C)  Resp: 18    Exam  General: Well developed, frail, no distress  HEENT: [place], ecchymosis above  right eye-improved, mucous membranes moist.   Cardiovascular: S1 S2 auscultated, irregularly irregular  Respiratory: Clear to auscultation bilaterally  Abdomen: Soft, nontender, nondistended, + bowel sounds  Extremities: warm dry without cyanosis clubbing. +2 pitting edema in LE B/L (improving)  Neuro: AAOx3, nonfocal  Skin: Lower extremity erythema and edema-improved, small scab on right lower extremity  Psych: Appropriate mood and affect, pleasant  Discharge Instructions      Discharge Instructions    Discharge instructions    Complete by:  As directed   Patient will be discharged to home with home health PT, SW, RN.  Patient will need to follow up with primary care provider within one week of discharge, repeat BMP and blood pressure managament..  Patient should continue medications as prescribed.  Patient should follow a heart healthy diet.            Medication List    TAKE these medications        aspirin EC 325 MG tablet  Take 1 tablet (325 mg total) by mouth daily.     benazepril-hydrochlorthiazide 20-25 MG per tablet  Commonly known as:  LOTENSIN HCT  Take 0.5 tablets by mouth daily.     doxycycline 100 MG tablet  Commonly known as:  VIBRA-TABS  Take 1 tablet (100 mg total) by mouth 2 (two) times daily.     FISH OIL PO  Take 1 capsule by mouth daily.     furosemide 20 MG tablet  Commonly known as:  LASIX  Take 1 tablet (20 mg total) by mouth daily.     metoprolol tartrate 25 MG tablet  Commonly known as:  LOPRESSOR  Take 0.5 tablets (12.5 mg total) by mouth 2 (two) times daily.     primidone 50 MG tablet  Commonly known as:  MYSOLINE  Take two tablets by mouth once daily     saccharomyces boulardii 250 MG capsule  Commonly known as:  FLORASTOR  Take 1 capsule (250 mg total) by mouth 2 (two) times daily.     traMADol 50 MG tablet  Commonly known as:  ULTRAM  Take 1 tablet (50 mg total) by mouth every 8 (eight) hours as needed for moderate pain  or severe pain.     VITAMIN D-3 PO  Take 2,000 Units by mouth daily.       No Known Allergies Follow-up Information    Follow up with REED, TIFFANY, DO. Schedule an appointment as soon as possible for a visit in 1 week.   Specialty:  Geriatric Medicine   Why:  Hospital follow up, repeat BMP, blood prssure management   Contact information:   [address] [place] [address] [phone]        The results of significant diagnostics from this hospitalization (including imaging, microbiology, ancillary and laboratory) are listed below for reference.    Significant Diagnostic Studies: Dg Chest 2 View  05/05/2015   CLINICAL DATA:  Pain following fall 2 days prior. Confusion. Hypertension.  EXAM: CHEST  2 VIEW  COMPARISON:  March 21, 2015  FINDINGS: There is no edema or consolidation. Heart is enlarged with pulmonary vascularity within normal limits. Aorta is tortuous and prominent but stable. No pneumothorax. No adenopathy. Bones are osteoporotic. Multiple compression fractures in the thoracic spine appear stable.  IMPRESSION: Multiple stable appearing fractures in the thoracic spine. Bones osteoporotic. Tortuous aorta is likely due to chronic hypertension and is stable. There  is stable cardiomegaly. Lungs clear.   Electronically Signed   By: Bretta Bang III M.D.   On: 05/05/2015 12:54   Dg Tibia/fibula Left  04/21/2015   CLINICAL DATA:  Per EMS, pt sent from urgent care for cellulitis. Pt complains of redness, edema to her bilateral lower ankles. Pt has quarter sized wound to left lower anterior extremity with pain. Pt unable to recall any injury.  EXAM: LEFT TIBIA AND FIBULA - 2 VIEW  COMPARISON:  None.  FINDINGS: No fracture or bone lesion. Bones are demineralized. Ankle and knee joints are normally aligned.  There is diffuse subcutaneous soft tissue edema. There is a small focal soft tissue defect along the anterior aspect of the lower leg. There is no radiopaque foreign body. No  soft tissue air.  There is no area of bone resorption to suggest osteomyelitis.  IMPRESSION: No fracture or dislocation. No bone lesion. No evidence of osteomyelitis. No soft tissue air. Small soft tissue wound to the anterior lower leg.   Electronically Signed   By: Amie Portland M.D.   On: 04/21/2015 19:56   Ct Head Wo Contrast  05/05/2015   CLINICAL DATA:  Pain following fall 2 days prior.  Confusion.  EXAM: CT HEAD WITHOUT CONTRAST  CT CERVICAL SPINE WITHOUT CONTRAST  TECHNIQUE: Multidetector CT imaging of the head and cervical spine was performed following the standard protocol without intravenous contrast. Multiplanar CT image reconstructions of the cervical spine were also generated.  COMPARISON:  Head CT May 11, 2013  FINDINGS: CT HEAD FINDINGS  Moderate diffuse atrophy is stable. There is no intracranial mass, hemorrhage, extra-axial fluid collection, or midline shift. There is small vessel disease throughout the centra semiovale bilaterally. There is no new gray-white compartment lesion. Bones are osteoporotic. The bony calvarium appears intact. The mastoid air cells are clear. There is a small right frontal scalp hematoma.  CT CERVICAL SPINE FINDINGS  There is no demonstrable fracture or spondylolisthesis. Prevertebral soft tissues and predental space regions are normal.  There is ankylosis at C5 and C6. There is marked disc space narrowing at C6-7. There is mild disc space narrowing at C7-T1 and T1-2. There is facet osteoarthritic change at all levels bilaterally. There is no frank disc extrusion or stenosis.  Bones are diffusely osteoporotic.  There is scarring and pleural thickening in each lung apex. Thyroid appears normal. There are multiple foci of carotid artery calcification bilaterally.  IMPRESSION: CT head: Stable atrophy with supratentorial small vessel disease. No acute infarct evident. No intracranial mass, hemorrhage, or extra-axial fluid collection. Bones osteoporotic. Small right  frontal scalp hematoma.  CT cervical spine: Multilevel osteoarthritic change. Note that there is ankylosis at C5-6. No acute fracture or spondylolisthesis. Bones diffusely osteoporotic. Extensive carotid artery calcification noted bilaterally.   Electronically Signed   By: Bretta Bang III M.D.   On: 05/05/2015 13:15   Ct Cervical Spine Wo Contrast  05/05/2015   CLINICAL DATA:  Pain following fall 2 days prior.  Confusion.  EXAM: CT HEAD WITHOUT CONTRAST  CT CERVICAL SPINE WITHOUT CONTRAST  TECHNIQUE: Multidetector CT imaging of the head and cervical spine was performed following the standard protocol without intravenous contrast. Multiplanar CT image reconstructions of the cervical spine were also generated.  COMPARISON:  Head CT May 11, 2013  FINDINGS: CT HEAD FINDINGS  Moderate diffuse atrophy is stable. There is no intracranial mass, hemorrhage, extra-axial fluid collection, or midline shift. There is small vessel disease throughout the centra semiovale bilaterally. There is  no new gray-white compartment lesion. Bones are osteoporotic. The bony calvarium appears intact. The mastoid air cells are clear. There is a small right frontal scalp hematoma.  CT CERVICAL SPINE FINDINGS  There is no demonstrable fracture or spondylolisthesis. Prevertebral soft tissues and predental space regions are normal.  There is ankylosis at C5 and C6. There is marked disc space narrowing at C6-7. There is mild disc space narrowing at C7-T1 and T1-2. There is facet osteoarthritic change at all levels bilaterally. There is no frank disc extrusion or stenosis.  Bones are diffusely osteoporotic.  There is scarring and pleural thickening in each lung apex. Thyroid appears normal. There are multiple foci of carotid artery calcification bilaterally.  IMPRESSION: CT head: Stable atrophy with supratentorial small vessel disease. No acute infarct evident. No intracranial mass, hemorrhage, or extra-axial fluid collection. Bones  osteoporotic. Small right frontal scalp hematoma.  CT cervical spine: Multilevel osteoarthritic change. Note that there is ankylosis at C5-6. No acute fracture or spondylolisthesis. Bones diffusely osteoporotic. Extensive carotid artery calcification noted bilaterally.   Electronically Signed   By: Bretta Bang III M.D.   On: 05/05/2015 13:15   Mr Brain Wo Contrast  05/06/2015   CLINICAL DATA:  Initial evaluation for acute altered mental status.  EXAM: MRI HEAD WITHOUT CONTRAST  TECHNIQUE: Multiplanar, multiecho pulse sequences of the brain and surrounding structures were obtained without intravenous contrast.  COMPARISON:  Prior CT from earlier the same day.  FINDINGS: Diffuse prominence of the CSF containing spaces is compatible with generalized cerebral atrophy. Advanced chronic microvascular ischemic changes present within the periventricular and deep white matter both cerebral hemispheres. Scattered chronic micro hemorrhages present within the bilateral basal ganglia and thalamus, likely related to chronic underlying hypertension. Few additional scattered chronic micro hemorrhages present within the bilateral occipital regions as well as the periventricular white matter. Susceptibility artifact present within the bilateral dentate nuclei.  No abnormal foci of restricted diffusion to suggest acute intracranial infarct. Normal intravascular flow voids maintained. Gray-white matter differentiation maintained. No acute intracranial hemorrhage.  No mass lesion, midline shift or mass effect. No hydrocephalus. No extra-axial fluid collection.  Craniocervical junction within normal limits. Scattered multilevel degenerative changes noted within the visualized upper cervical spine. Pituitary gland normal.  No acute abnormality about the orbits. Sequela prior bilateral lens extraction noted.  Bilateral frontal sinus disease noted, left greater than right. Scattered mucosal thickening within the ethmoidal air  cells. No mastoid effusion. Inner ear structures grossly normal.  Bone marrow signal intensity within normal limits. Scalp soft tissues unremarkable.  IMPRESSION: 1. No acute intracranial process identified. 2. Generalized cerebral atrophy with moderate chronic microvascular ischemic disease. 3. Multiple subcentimeter chronic micro hemorrhages primarily centered about the bilateral thalami and basal ganglia, likely related to chronic underlying hypertension. 4. Bilateral frontal sinus disease, left greater than right.   Electronically Signed   By: Rise Mu M.D.   On: 05/06/2015 00:23    Microbiology: No results found for this or any previous visit (from the past 240 hour(s)).   Labs: Basic Metabolic Panel:  Recent Labs Lab 05/05/15 1235 05/06/15 1135  NA 139 137  K 3.3* 3.9  CL 104 103  CO2 27 28  GLUCOSE 97 98  BUN 18 11  CREATININE 0.80 0.73  CALCIUM 10.3 9.5   Liver Function Tests:  Recent Labs Lab 05/05/15 1235  AST 27  ALT 19  ALKPHOS 56  BILITOT 1.2  PROT 6.5  ALBUMIN 3.6   No results for input(s):  LIPASE, AMYLASE in the last 168 hours. No results for input(s): AMMONIA in the last 168 hours. CBC:  Recent Labs Lab 05/05/15 1235  WBC 5.4  NEUTROABS 3.3  HGB 13.8  HCT 41.7  MCV 91.9  PLT 235   Cardiac Enzymes: No results for input(s): CKTOTAL, CKMB, CKMBINDEX, TROPONINI in the last 168 hours. BNP: BNP (last 3 results) No results for input(s): BNP in the last 8760 hours.  ProBNP (last 3 results) No results for input(s): PROBNP in the last 8760 hours.  CBG: No results for input(s): GLUCAP in the last 168 hours.     SignedEdsel Petrin  [facility] 05/07/2015, 10:11 AM

## 2015-05-07 NOTE — Discharge Instructions (Signed)

## 2015-05-07 NOTE — Progress Notes (Signed)
Nsg Discharge Note  Admit Date:  05/05/2015 Discharge date: 05/07/2015   Alejandra Bailey to be D/C'd Home per MD order.  AVS completed.  Copy for chart, and copy for patient signed, and dated. Patient/caregiver able to verbalize understanding.  Discharge Medication:   Medication List    TAKE these medications        aspirin EC 325 MG tablet  Take 1 tablet (325 mg total) by mouth daily.     benazepril-hydrochlorthiazide 20-25 MG per tablet  Commonly known as:  LOTENSIN HCT  Take 0.5 tablets by mouth daily.  Notes to Patient:  Take BP daily     doxycycline 100 MG tablet  Commonly known as:  VIBRA-TABS  Take 1 tablet (100 mg total) by mouth 2 (two) times daily.     FISH OIL PO  Take 1 capsule by mouth daily.     furosemide 20 MG tablet  Commonly known as:  LASIX  Take 1 tablet (20 mg total) by mouth daily.  Notes to Patient:  Take BP daily     metoprolol tartrate 25 MG tablet  Commonly known as:  LOPRESSOR  Take 0.5 tablets (12.5 mg total) by mouth 2 (two) times daily.  Notes to Patient:  Take BP daily     primidone 50 MG tablet  Commonly known as:  MYSOLINE  Take two tablets by mouth once daily     saccharomyces boulardii 250 MG capsule  Commonly known as:  FLORASTOR  Take 1 capsule (250 mg total) by mouth 2 (two) times daily.     traMADol 50 MG tablet  Commonly known as:  ULTRAM  Take 1 tablet (50 mg total) by mouth every 8 (eight) hours as needed for moderate pain or severe pain.     VITAMIN D-3 PO  Take 2,000 Units by mouth daily.        Discharge Assessment: Filed Vitals:   05/07/15 1320  BP: 125/70  Pulse: 94  Temp: 98.8 F (37.1 C)  Resp: 20  Skin clean, dry and intact without evidence of skin break down, no evidence of skin tears noted. IV catheter discontinued intact. Site without signs and symptoms of complications - no redness or edema noted at insertion site, patient denies c/o pain - only slight tenderness at site.  Dressing with slight  pressure applied.  D/c Instructions-Education: Discharge instructions given to patient/family with verbalized understanding. D/c education completed with patient/family including follow up instructions, medication list, d/c activities limitations if indicated, with other d/c instructions as indicated by MD - patient able to verbalize understanding, all questions fully answered. Patient instructed to return to ED, call 911, or call MD for any changes in condition.  Patient escorted via WC, and D/C home via private auto.  Camillo Flaming, RN 05/07/2015 6:42 PM

## 2015-05-07 NOTE — Care Management Note (Signed)
Case Management Note  Patient Details  Name: Alejandra Bailey MRN: [id number] Date of Birth: 06/06/1923  Subjective/Objective:                  Fall Action/Plan: Discharge planning Expected Discharge Date:  05/06/15               Expected Discharge Plan:  [name]  In-House Referral:     Discharge planning Services  CM Consult  Post Acute Care Choice:  Home Health Choice offered to:  [facility] POA / Guardian  DME Arranged:    DME Agency:  [facility] Arranged:  RN, PT, OT, Nurse's Aide [place] Agency:  [place]  Status of Service:  Completed, signed off  Medicare Important Message Given:    Date Medicare IM Given:    Medicare IM give by:    Date Additional Medicare IM Given:    Additional Medicare Important Message give by:     If discussed at [place] of Stay Meetings, dates discussed:    Additional Comments: CM met with pt and called family to offer choice of home health agency.  Family choose [facility] to render HHPT/OT/RN/Aide/SW.  Address verified with family and best contact is son or daughter in law at [phone].  Referral called to [facility] rep, Tiffany.  [facility] number placed on AVS as family expressed dissatisfaction with current PCP.  No other CM needs were communicated. Dellie Catholic, RN 05/07/2015, 11:35 AM

## 2015-05-07 NOTE — Evaluation (Addendum)
Occupational Therapy Evaluation Patient Details Name: Alejandra Bailey MRN: [id number] DOB: 01/22/1923 Today's Date: 05/07/2015    History of Present Illness Alejandra Bailey is a 81 y.o. female with recent fall, negative on CT, and AMS,with history of LE edema and cellulitis; PMHx: thoracic compression fx, parkinson's, afib   Clinical Impression   Pt admitted with above. Pt planning to d/c today, so deferring all further OT needs to next venue of care. Recommending HHOT and notified nurse and case manager.    Follow Up Recommendations  Home health OT;Supervision/Assistance - 24 hour    Equipment Recommendations  None recommended by OT    Recommendations for Other Services       Precautions / Restrictions Precautions Precautions: Fall Restrictions Weight Bearing Restrictions: No      Mobility Bed Mobility               General bed mobility comments: not assessed  Transfers Overall transfer level: Needs assistance   Transfers: Sit to/from Stand Sit to Stand: Min guard    Comments: Used RW for support upon standing            Balance  history of fall; used RW for ambulation-Min assist for management of walker in tight space.                                           ADL Overall ADL's : Needs assistance/impaired     Grooming: Brushing hair;Set up;Supervision/safety;Standing   Upper Body Bathing: Set up;Sitting         Upper Body Dressing Details (indicate cue type and reason): Mod assist with managing gown, but feel she could manage a shirt     Toilet Transfer: Ambulation;Minimal assistance;RW (sit to stand from chair)           Functional mobility during ADLs: Rolling walker;Minimal assistance General ADL Comments: Educated on safety such as use of bag on walker. Educated on deep breathing technique.     Vision     Perception     Praxis      Pertinent Vitals/Pain Pain Assessment: No/denies pain (at beginning and  end of session pt reported no pain; reported pain at IV site when OT was testing UE strength)     Hand Dominance     Extremity/Trunk Assessment Upper Extremity Assessment Upper Extremity Assessment: Generalized weakness   Lower Extremity Assessment Lower Extremity Assessment: Defer to PT evaluation       Communication Communication Communication: HOH;Expressive difficulties   Cognition Arousal/Alertness: Awake/alert Behavior During Therapy: WFL for tasks assessed/performed Overall Cognitive Status: No family/caregiver present to determine baseline cognitive functioning                     General Comments       Exercises       Shoulder Instructions      Home Living Family/patient expects to be discharged to:: Private residence Living Arrangements: Alone Available Help at Discharge: Family;Available PRN/intermittently Type of Home: Independent living facility       Home Layout: One level     Bathroom Shower/Tub: Producer, television/film/video: Standard     Home Equipment: Environmental consultant - 2 wheels;Shower seat - built in (pt reports she has something for her toilet-BSC or riser?)          Prior Functioning/Environment Level of Independence: Independent with  assistive device(s)        Comments: per pt she ambulates with RW     OT Diagnosis: Generalized weakness   OT Problem List: Decreased strength;Decreased activity tolerance;Increased edema;Decreased knowledge of use of DME or AE;Decreased knowledge of precautions; edema   OT Treatment/Interventions:      OT Goals(Current goals can be found in the care plan section) Acute Rehab OT Goals Patient Stated Goal: not stated  OT Frequency:     Barriers to D/C:            Co-evaluation              End of Session Equipment Utilized During Treatment: Gait belt;Rolling walker Nurse Communication: Other (comment) (recommending HHOT)  Activity Tolerance: Patient tolerated treatment  well Patient left: in chair;with call bell/phone within reach   Time: 1610-9604 OT Time Calculation (min): 19 min Charges:  OT General Charges $OT Visit: 1 Procedure OT Evaluation $Initial OT Evaluation Tier I: 1 Procedure G-Codes: OT G-codes **NOT FOR INPATIENT CLASS** Functional Assessment Tool Used: clinical judgment Functional Limitation: Self care Self Care Current Status (V4098): At least 20 percent but less than 40 percent impaired, limited or restricted Self Care Goal Status (J1914): At least 20 percent but less than 40 percent impaired, limited or restricted Self Care Discharge Status [phone]): At least 20 percent but less than 40 percent impaired, limited or restricted  Earlie Raveling OTR/L [phone] 05/07/2015, 12:20 PM

## 2015-05-11 DIAGNOSIS — L03116 Cellulitis of left lower limb: Secondary | ICD-10-CM | POA: Diagnosis not present

## 2015-05-11 DIAGNOSIS — I10 Essential (primary) hypertension: Secondary | ICD-10-CM | POA: Diagnosis not present

## 2015-05-11 DIAGNOSIS — G20 Parkinson's disease: Secondary | ICD-10-CM | POA: Diagnosis not present

## 2015-05-11 DIAGNOSIS — L03115 Cellulitis of right lower limb: Secondary | ICD-10-CM | POA: Diagnosis not present

## 2015-06-08 ENCOUNTER — Encounter: Payer: Self-pay | Admitting: Nurse Practitioner

## 2015-06-08 ENCOUNTER — Other Ambulatory Visit (HOSPITAL_COMMUNITY): Payer: Self-pay | Admitting: Family Medicine

## 2015-06-08 DIAGNOSIS — R609 Edema, unspecified: Secondary | ICD-10-CM

## 2015-06-08 DIAGNOSIS — R7989 Other specified abnormal findings of blood chemistry: Secondary | ICD-10-CM

## 2015-06-14 ENCOUNTER — Other Ambulatory Visit (HOSPITAL_COMMUNITY): Payer: Commercial Managed Care - HMO

## 2015-06-21 ENCOUNTER — Other Ambulatory Visit: Payer: Self-pay

## 2015-06-21 ENCOUNTER — Ambulatory Visit (HOSPITAL_COMMUNITY): Payer: Commercial Managed Care - HMO | Attending: Family Medicine

## 2015-06-21 DIAGNOSIS — G20 Parkinson's disease: Secondary | ICD-10-CM | POA: Diagnosis not present

## 2015-06-21 DIAGNOSIS — R609 Edema, unspecified: Secondary | ICD-10-CM | POA: Diagnosis not present

## 2015-06-21 DIAGNOSIS — I071 Rheumatic tricuspid insufficiency: Secondary | ICD-10-CM | POA: Diagnosis not present

## 2015-06-21 DIAGNOSIS — I371 Nonrheumatic pulmonary valve insufficiency: Secondary | ICD-10-CM | POA: Insufficient documentation

## 2015-06-21 DIAGNOSIS — I340 Nonrheumatic mitral (valve) insufficiency: Secondary | ICD-10-CM | POA: Insufficient documentation

## 2015-06-21 DIAGNOSIS — I10 Essential (primary) hypertension: Secondary | ICD-10-CM | POA: Diagnosis not present

## 2015-06-21 DIAGNOSIS — R799 Abnormal finding of blood chemistry, unspecified: Secondary | ICD-10-CM

## 2015-06-21 DIAGNOSIS — I358 Other nonrheumatic aortic valve disorders: Secondary | ICD-10-CM | POA: Diagnosis not present

## 2015-06-21 DIAGNOSIS — E785 Hyperlipidemia, unspecified: Secondary | ICD-10-CM | POA: Insufficient documentation

## 2015-06-21 DIAGNOSIS — R7989 Other specified abnormal findings of blood chemistry: Secondary | ICD-10-CM

## 2015-06-23 ENCOUNTER — Ambulatory Visit: Payer: Self-pay | Admitting: Internal Medicine

## 2015-07-05 ENCOUNTER — Telehealth: Payer: Self-pay

## 2015-07-05 NOTE — Telephone Encounter (Signed)
Message was left on triage voicemail requesting order for Re-certification for Skilled Nursing. No PCP is listed in patient's chart, patient has cancelled appointment's with Dr.Reed and has no pending appointment.  Left message on voicemail for patient to return call when available , reason for call is to confirm PCP.

## 2015-07-27 NOTE — Telephone Encounter (Signed)
Call was never returned. 

## 2015-08-04 ENCOUNTER — Emergency Department (HOSPITAL_COMMUNITY): Payer: Commercial Managed Care - HMO

## 2015-08-04 ENCOUNTER — Emergency Department (HOSPITAL_COMMUNITY)
Admission: EM | Admit: 2015-08-04 | Discharge: 2015-08-04 | Disposition: A | Discharge status: Home / Self Care | Payer: Commercial Managed Care - HMO | Attending: Emergency Medicine | Admitting: Emergency Medicine

## 2015-08-04 ENCOUNTER — Encounter (HOSPITAL_COMMUNITY): Payer: Self-pay | Admitting: Family Medicine

## 2015-08-04 DIAGNOSIS — Z79899 Other long term (current) drug therapy: Secondary | ICD-10-CM | POA: Diagnosis not present

## 2015-08-04 DIAGNOSIS — E7800 Pure hypercholesterolemia, unspecified: Secondary | ICD-10-CM | POA: Diagnosis not present

## 2015-08-04 DIAGNOSIS — Z8781 Personal history of (healed) traumatic fracture: Secondary | ICD-10-CM | POA: Insufficient documentation

## 2015-08-04 DIAGNOSIS — M1990 Unspecified osteoarthritis, unspecified site: Secondary | ICD-10-CM | POA: Insufficient documentation

## 2015-08-04 DIAGNOSIS — R05 Cough: Secondary | ICD-10-CM | POA: Insufficient documentation

## 2015-08-04 DIAGNOSIS — R2243 Localized swelling, mass and lump, lower limb, bilateral: Secondary | ICD-10-CM | POA: Diagnosis present

## 2015-08-04 DIAGNOSIS — G8929 Other chronic pain: Secondary | ICD-10-CM | POA: Insufficient documentation

## 2015-08-04 DIAGNOSIS — R0602 Shortness of breath: Secondary | ICD-10-CM | POA: Insufficient documentation

## 2015-08-04 DIAGNOSIS — R0981 Nasal congestion: Secondary | ICD-10-CM | POA: Insufficient documentation

## 2015-08-04 DIAGNOSIS — I10 Essential (primary) hypertension: Secondary | ICD-10-CM | POA: Diagnosis not present

## 2015-08-04 DIAGNOSIS — G20 Parkinson's disease: Secondary | ICD-10-CM | POA: Insufficient documentation

## 2015-08-04 DIAGNOSIS — Z7982 Long term (current) use of aspirin: Secondary | ICD-10-CM | POA: Diagnosis not present

## 2015-08-04 DIAGNOSIS — I482 Chronic atrial fibrillation: Secondary | ICD-10-CM | POA: Insufficient documentation

## 2015-08-04 DIAGNOSIS — R0989 Other specified symptoms and signs involving the circulatory and respiratory systems: Secondary | ICD-10-CM | POA: Insufficient documentation

## 2015-08-04 DIAGNOSIS — Z792 Long term (current) use of antibiotics: Secondary | ICD-10-CM | POA: Insufficient documentation

## 2015-08-04 DIAGNOSIS — Z8619 Personal history of other infectious and parasitic diseases: Secondary | ICD-10-CM | POA: Diagnosis not present

## 2015-08-04 DIAGNOSIS — M7989 Other specified soft tissue disorders: Secondary | ICD-10-CM

## 2015-08-04 LAB — CBC WITH DIFFERENTIAL/PLATELET
BASOS PCT: 1 %
Basophils Absolute: 0 10*3/uL (ref 0.0–0.1)
Eosinophils Absolute: 0.2 10*3/uL (ref 0.0–0.7)
Eosinophils Relative: 2 %
HEMATOCRIT: 43.7 % (ref 36.0–46.0)
Hemoglobin: 14.1 g/dL (ref 12.0–15.0)
Lymphocytes Relative: 26 %
Lymphs Abs: 1.7 10*3/uL (ref 0.7–4.0)
MCH: 30.5 pg (ref 26.0–34.0)
MCHC: 32.3 g/dL (ref 30.0–36.0)
MCV: 94.4 fL (ref 78.0–100.0)
MONO ABS: 0.6 10*3/uL (ref 0.1–1.0)
MONOS PCT: 10 %
NEUTROS ABS: 4 10*3/uL (ref 1.7–7.7)
Neutrophils Relative %: 61 %
Platelets: 336 10*3/uL (ref 150–400)
RBC: 4.63 MIL/uL (ref 3.87–5.11)
RDW: 14.6 % (ref 11.5–15.5)
WBC: 6.5 10*3/uL (ref 4.0–10.5)

## 2015-08-04 LAB — I-STAT CHEM 8, ED
BUN: 16 mg/dL (ref 6–20)
CREATININE: 0.8 mg/dL (ref 0.44–1.00)
Calcium, Ion: 1.23 mmol/L (ref 1.13–1.30)
Chloride: 98 mmol/L — ABNORMAL LOW (ref 101–111)
GLUCOSE: 128 mg/dL — AB (ref 65–99)
HEMATOCRIT: 43 % (ref 36.0–46.0)
HEMOGLOBIN: 14.6 g/dL (ref 12.0–15.0)
Potassium: 3.5 mmol/L (ref 3.5–5.1)
Sodium: 139 mmol/L (ref 135–145)
TCO2: 32 mmol/L (ref 0–100)

## 2015-08-04 LAB — BRAIN NATRIURETIC PEPTIDE: B Natriuretic Peptide: 227.5 pg/mL — ABNORMAL HIGH (ref 0.0–100.0)

## 2015-08-04 MED ORDER — FUROSEMIDE 20 MG PO TABS
20.0000 mg | ORAL_TABLET | Freq: Once | ORAL | Status: AC
  Administered 2015-08-04: 20 mg via ORAL
  Filled 2015-08-04: qty 1

## 2015-08-04 NOTE — ED Notes (Signed)
Pt's daughter in law states is not able to transport patient from car into home due to patient's weakened lower extremities.  Requests PTAR for transport.

## 2015-08-04 NOTE — ED Notes (Signed)
Pt's daughter in law called back and states to cancel PTAR request, she will pick up patient with her husband. Pt able to stand and transfer to wheelchair with assistance.

## 2015-08-04 NOTE — ED Notes (Signed)
Pt and pt's daughter in law and pt's son comfortable with discharge and follow up instructions. No prescriptions given. Patient's depends are dry, clothing provided by family and put on patient by this RN.  This RN assisted in transfer into car.  Pt able to ambulate with assistance short distances.

## 2015-08-04 NOTE — Consult Note (Signed)
Contacted by bedside nurse regarding need for duofoam dressings for this patient's LE.  I have provided bedside nurse with comparable foam dressing we carry in the [facility] system. She will order for use.   Morning Halberg [place] RN, [place] [phone]

## 2015-08-04 NOTE — Discharge Instructions (Signed)
Continue usual home medications Keep legs elevated as much as possible Please follow up with your primary doctor in 3 days for discussion of your diagnoses and further evaluation after today's visit; if you do not have a primary care doctor use the resource guide provided to find one; Please return to the ER for difficulty breathing, fever, any new or worsening symptoms, any additional concerns.    Emergency Department Resource Guide 1) Find a Doctor and Pay Out of Pocket Although you won't have to find out who is covered by your insurance plan, it is a good idea to ask around and get recommendations. You will then need to call the office and see if the doctor you have chosen will accept you as a new patient and what types of options they offer for patients who are self-pay. Some doctors offer discounts or will set up payment plans for their patients who do not have insurance, but you will need to ask so you aren't surprised when you get to your appointment.  2) Contact Your Local Health Department Not all health departments have doctors that can see patients for sick visits, but many do, so it is worth a call to see if yours does. If you don't know where your local health department is, you can check in your phone book. The CDC also has a tool to help you locate your state's health department, and many state websites also have listings of all of their local health departments.  3) Find a Walk-in Clinic If your illness is not likely to be very severe or complicated, you may want to try a walk in clinic. These are popping up all over the country in pharmacies, drugstores, and shopping centers. They're usually staffed by nurse practitioners or physician assistants that have been trained to treat common illnesses and complaints. They're usually fairly quick and inexpensive. However, if you have serious medical issues or chronic medical problems, these are probably not your best option.  No Primary Care  Doctor: - Call Health Connect at  [phone] - they can help you locate a primary care doctor that  accepts your insurance, provides certain services, etc. - Physician Referral Service- [phone]  Chronic Pain Problems: Organization         Address  Phone   Notes  [facility]  [phone] Patients need to be referred by their primary care doctor.   Medication Assistance: Organization         Address  Phone   Notes  [facility] [address] [place], [place] [address] [phone] --Must be a resident of [facility] -- Must have NO insurance coverage whatsoever (no Medicaid/ Medicare, etc.) -- The pt. MUST have a primary care doctor that directs their care regularly and follows them in the community   MedAssist  [phone]   [employer]  [phone]) [phone]    Agencies that provide inexpensive medical care: Organization         Address  Phone   Notes  [facility]  [phone]) [phone]   [facility]    [phone]) [phone]   [facility] [address] [place], [place] [address] [phone]   [facility] [address], [place] [phone]) [phone]   [facility]anned Parenthood    [phone]   [facility]    [phone]) [phone]   [facility]  [phone]  [address], [place] Phone:  [phone], Fax:  [phone] Hours of Operation:  9 am - 6 pm, M-F.  Also accepts Medicaid/Medicare and self-pay.  [facility] for [place] [address], [address], [place] Phone: [phone], Fax: [phone]. Hours of Operation:  8:30 am - 5:30 pm, M-F.  Also accepts Medicaid and self-pay.  [facility] [place] [address], [place] Phone: [phone]   [place], [place], [place] [phone], Ext. [phone] Mondays & Thursdays: 7-9 AM.  First 15 patients are seen on a first  come, first serve basis.    [place] Providers:  Organization         Address  Phone   Notes  [facility] [address], [place] [phone] Also accepts self-pay patients.  [facility] [address], [place]  [phone]   [place], [address], [place] [phone]   [facility] [address], [place] [phone]   Lucianne Lei [address], [place]   [phone] Only accepts [place] Access [place] patients after they have their name applied to their card.   Self-Pay (no insurance) in [employer]:  Organization         Address  Phone   Notes  Sickle Cell Patients, [facility] [place] [phone]   [facility] [place] [phone]   [facility] [place]  [place], [place], [place] [phone]   [facility]/Dr. Osei-Bonsu  [address] [place] or [address], [place] [phone] Phone number for both [place] and [place] locations is the same.  Urgent Medical and [facility] [address] [place] [phone]   [facility] [address] [place] or [address] [phone]   [facility] [address] [place] [phone], phone; [phone], fax Sees patients 1st and 3rd Saturday of every month.  Must not qualify for public or private insurance (i.e. Medicaid, Medicare, [place] Health Choice, Veterans' Benefits)  Household income should be no more than 200% of the poverty level The clinic cannot treat you if you are pregnant or think you are pregnant  Sexually transmitted diseases are not treated at the clinic.    Dental Care: Organization          Address  Phone  Notes  [facility] of [facility] [phone] Accepts children up to age 31 who are enrolled in [place] or [place]; pregnant women with a Medicaid card; and children who have applied for Medicaid or [place] Health Choice, but were declined, whose parents can pay a reduced fee at time of service.  [facility] Department of [facility]  [address], [place] [phone] Accepts children up to age 70 who are enrolled in [place] or [place]; pregnant women with a Medicaid card; and children who have applied for Medicaid or [place] Health Choice, but were declined, whose parents can pay a reduced fee at time of service.  [facility] Adult Dental Access PROGRAM  [place], [place] [phone] Patients  are seen by appointment only. Walk-ins are not accepted. [place] will see patients 52 years of age and older. Monday - Tuesday (8am-5pm) Most Wednesdays (8:30-5pm) $30 per visit, cash only  [facility] Adult Dental Access PROGRAM  [address], [facility] [phone] Patients are seen by appointment only. Walk-ins are not accepted. [place] will see patients 31 years of age and older. One Wednesday Evening (Monthly: Volunteer Based).  $30 per visit, cash only  [place]  [phone] for adults; Children under age 35, call Graduate Pediatric Dentistry at [phone]. Children aged 54-14, please call [phone] to request a pediatric application.  Dental services are provided in all areas of dental care including fillings, crowns and bridges, complete and partial dentures, implants, gum treatment, root canals, and extractions. Preventive care is also provided. Treatment is provided to both adults and children. Patients are selected via a lottery and there is often a waiting list.   [facility] [address], [place]  [phone] [email or web address]   [facility] [address] [place], [place] [phone], Ext. [phone] Second and Fourth Thursday of each month, opens at 6:30 AM; Clinic ends at 9 AM.  Patients are seen on a first-come first-served basis, and a limited number are seen during each clinic.   [facility]  [address] Hillard Danker [place], [place] [phone]   Eligibility Requirements You must have lived in [place], [place], or [place] counties for at least the last three months.   You cannot be eligible for state or federal sponsored [employer], including [employer], [place], or [employer].   You generally cannot be eligible for healthcare insurance through your employer.    How to apply: Eligibility screenings are held every Tuesday and Wednesday afternoon from 1:00 pm until 4:00 pm. You do not need an appointment for the interview!  [facility] [address], [place], [place]   [place]  [place] Department  [place]  [phone]    Behavioral Health Resources in the Community: Intensive Outpatient Programs Organization         Address  Phone  Notes  Esmeralda Morehouse. [address], [place], [place] [phone]   [facility] Outpatient [address] [place], [place]   ADS: Alcohol & Drug Svcs [address] [place], [place]   [place] [address],  [place], [place] or [phone]   Substance Abuse Resources Organization         Address  Phone  Notes  Alcohol and Drug Services  [phone]   [place]  [phone]   [facility] [place]   Chinita Pester  [phone]   Residential & Outpatient Substance Abuse Program  [phone]   Psychological  Services Organization         Address  Phone  Notes  [facility] [place]  [place]  [phone]   [place] [address] [place] or [phone]    Mobile Crisis Teams Organization         Address  Phone  Notes  [facility], Mobile Crisis Care Unit  [phone]   [facility] Psychotherapeutic Services  [address] [place], [place]   [place] [address], [place] 18  [place] [phone]    Self-Help/Support Groups Organization         Address  Phone             Notes  [facility] [place] - variety of support groups  [place] Call for more information  Narcotics Anonymous (NA), [facility] [address], [employer] Callender  2 meetings at this location   Special educational needs teacher         Address  Phone  Notes  [facility] Residential Treatment [place],    [place]  [phone]   [facility]  [address], [place] [phone], [place], [place]   [place], [place] [phone] Admissions: 8am-3pm M-F  Incentives Substance [place] [address],    [place], [place] [phone]   [facility] [address] [place], [place], [place]   [facility] [address],  [place], [place]   [facility]ight Programs - Intensive Outpatient [address] Kristeen Mans [address], [place], [place]   [facility] [place].,  [place], [place] [phone] or [phone]   [facility] Services (RTS) [address] [place], [place] Accepts Medicaid  [facility] [place] [address],  [place] [phone] Substance Abuse/Addiction Treatment   [facility] Organization         Address  Phone  Notes  [facility]  [phone]   Domenic Schwab, PhD [address] Arlis Porta [place], [place]   [phone] or [phone]   [place], [place] [phone]   [facility] [address], [place], [place] [phone] Insurance/Medicaid/sponsorship through [facility] and Families [address] [place]                                    [place], [place] [phone] [place] [address], [place] [phone]    Dr. Adele Schilder  [phone]   [facility] Dept. 1) [address], [place] 2) [place] 3)  [place] [address], [place] [phone]  [phone]   [place] [phone] or [phone] (After Hours)

## 2015-08-04 NOTE — ED Notes (Signed)
Called patient's son/daughter in law and left callback number on machine per pt. Pt to be discharged home.

## 2015-08-04 NOTE — ED Notes (Addendum)
Pt presents from home via [facility] with BLE weakness x1 day.  She is being treated for BLE cellulitis/edema and was supposed to go to her doctor's office today to have her legs re-wrapped, but was unable to ambulate to the car.  She denies pain.  BLE are reddened, +3 pitting edema from feet up to the knee bilaterally. She also have a congested cough and nasal congestion x1wk - denies fevers/chills, reports family members have had a cold recently. She lives in senior living but it in the process of switching to assisted living.

## 2015-08-04 NOTE — ED Provider Notes (Signed)
CSN: [id number]     Arrival date & time 08/04/15  1215 History   First MD Initiated Contact with Patient 08/04/15 1221     Chief Complaint  Patient presents with  . Extremity Weakness     (Consider location/radiation/quality/duration/timing/severity/associated sxs/prior Treatment) The history is provided by the patient, a relative and medical records. No language interpreter was used.     Alejandra Bailey is a 81 y.o. female  with a PMH of afib, HTN, HLD who presents to the Emergency Department complaining of bilateral lower extremity swelling and wounds. Pt. And daughter in law at bedside state the LE swelling is chronic (x 1 year), and their main reason for coming to the ED today is for dressing changes of LE's. Per daughter-in-law, patient has just been diagnosed with mild-moderate dementia and states that she was suppose to go to PCP for dressing changes Friday but "it was a bad day" and cooperation was an issue leading to a missed appointment. Pt. Has recently completed round of doxycyline for cellulitis of this area. Pt. Denies fever/chills and pain. Pt. Admits to shortness of breath x 1 week.    Past Medical History  Diagnosis Date  . Hypertension   . Tremor   . High cholesterol   . History of pelvic fracture 2011    "no OR"  . History of measles   . History of mumps   . A-fib (HCC)   . Parkinson disease (HCC)     pt denies this hx on 05/05/2015  . Family history of adverse reaction to anesthesia   . Arthritis     "thumbs" (05/05/2015)  . Chronic back pain greater than 3 months duration    Past Surgical History  Procedure Laterality Date  . Tonsillectomy  1934  . Cataract extraction w/ intraocular lens  implant, bilateral Bilateral    Family History  Problem Relation Age of Onset  . Stroke Father   . Breast cancer Mother   . Gout Son   . Heart Problems Son     Triple Bypass  . Cancer Maternal Grandmother 7170    breast cancer  . Cancer Maternal Grandfather 50   stomach cancer   Social History  Substance Use Topics  . Smoking status: Never Smoker   . Smokeless tobacco: Never Used  . Alcohol Use: No   OB History    No data available     Review of Systems  Constitutional: Negative.   HENT: Positive for congestion. Negative for rhinorrhea and sore throat.   Eyes: Negative for visual disturbance.  Respiratory: Positive for cough and shortness of breath. Negative for wheezing.   Cardiovascular: Positive for leg swelling. Negative for chest pain and palpitations.  Gastrointestinal: Negative for nausea, vomiting, abdominal pain, diarrhea and constipation.  Endocrine: Negative for polydipsia and polyuria.  Genitourinary: Negative for dysuria.  Musculoskeletal: Negative for myalgias, back pain, arthralgias and neck pain.  Skin: Positive for wound. Negative for rash.  Neurological: Negative for dizziness and headaches.      Allergies  Review of patient's allergies indicates no known allergies.  Home Medications   Prior to Admission medications   Medication Sig Start Date End Date Taking? Authorizing Provider  aspirin EC 325 MG tablet Take 1 tablet (325 mg total) by mouth daily. 11/03/13   Oneal GroutMahima Pandey, MD  benazepril-hydrochlorthiazide (LOTENSIN HCT) 20-25 MG per tablet Take 0.5 tablets by mouth daily. 05/07/15   Maryann Mikhail, DO  Cholecalciferol (VITAMIN D-3 PO) Take 2,000 Units by mouth  daily.     Historical Provider, MD  doxycycline (VIBRA-TABS) 100 MG tablet Take 1 tablet (100 mg total) by mouth 2 (two) times daily. 05/07/15   Maryann Mikhail, DO  furosemide (LASIX) 20 MG tablet Take 1 tablet (20 mg total) by mouth daily. 04/24/15   Elease Etienne, MD  metoprolol tartrate (LOPRESSOR) 25 MG tablet Take 0.5 tablets (12.5 mg total) by mouth 2 (two) times daily. 05/07/15   Maryann Mikhail, DO  Omega-3 Fatty Acids (FISH OIL PO) Take 1 capsule by mouth daily.    Historical Provider, MD  primidone (MYSOLINE) 50 MG tablet Take two tablets by  mouth once daily 01/19/15   Sharon Seller, NP  saccharomyces boulardii (FLORASTOR) 250 MG capsule Take 1 capsule (250 mg total) by mouth 2 (two) times daily. 05/07/15   Maryann Mikhail, DO  traMADol (ULTRAM) 50 MG tablet Take 1 tablet (50 mg total) by mouth every 8 (eight) hours as needed for moderate pain or severe pain. Patient taking differently: Take 50 mg by mouth at bedtime.  04/24/15   Elease Etienne, MD   BP 136/92 mmHg  Pulse 71  Temp(Src) 97.6 F (36.4 C) (Oral)  Resp 20  SpO2 92% Physical Exam  Constitutional: She is oriented to person, place, and time. She appears well-developed and well-nourished.  Alert and in no acute distress  HENT:  Head: Normocephalic and atraumatic.  Cardiovascular: Normal heart sounds and intact distal pulses.  Exam reveals no gallop and no friction rub.   No murmur heard. Irregularly irregular.  Pulmonary/Chest: No respiratory distress. She has no wheezes. She exhibits no tenderness.  Crackles of R lung Coughing fits with deep breathing.  Abdominal: She exhibits no mass. There is no rebound and no guarding.  Abdomen soft, non-tender, non-distended Bowel sounds positive in all four quadrants  Musculoskeletal:  3+ edema of bilateral LE's  Neurological: She is alert and oriented to person, place, and time.  Skin: Skin is warm and dry.  Nursing note and vitals reviewed.   ED Course  Procedures (including critical care time) Labs Review Labs Reviewed  BRAIN NATRIURETIC PEPTIDE - Abnormal; Notable for the following:    B Natriuretic Peptide 227.5 (*)    All other components within normal limits  I-STAT CHEM 8, ED - Abnormal; Notable for the following:    Chloride 98 (*)    Glucose, Bld 128 (*)    All other components within normal limits  CBC WITH DIFFERENTIAL/PLATELET    Imaging Review Dg Chest 2 View  08/04/2015  CLINICAL DATA:  One week history of productive cough and congestion. EXAM: CHEST  2 VIEW COMPARISON:  May 05, 2015  FINDINGS: There is a moderate right pleural effusion with atelectasis and possibly consolidation in the right middle and lower lobes. Left lung is clear. Heart is upper normal in size with pulmonary vascularity within normal limits. Aorta is tortuous with atherosclerotic calcification. There is no demonstrable adenopathy. Bones diffusely osteoporotic with multiple compression fractures throughout the thoracic spine, a stable finding. There is thoracic and lumbar levoscoliosis. IMPRESSION: Moderate-sized right pleural effusion with right middle and lower lobe atelectasis and possibly consolidation. Left lung clear. Heart size upper normal. Multiple compression fractures appear stable. Bones diffusely osteoporotic. Electronically Signed   By: Bretta Bang III M.D.   On: 08/04/2015 13:39   I have personally reviewed and evaluated these images and lab results as part of my medical decision-making.   EKG Interpretation None      MDM  Final diagnoses:  Swelling of lower extremity   Ticia G Low presents with chronic bilateral LE swelling. Chart reviewed: last echo done on 06/21/15 showing EF of 55-60% and mild pulm HTN. Pt. States compliance of lasix  qd  Labs: All labs reviewed; BNP 227.5 Imaging: CXR: see above.  Therapeutics:  lasix given here  A&P:  Bilateral lower extremity swelling  - Chronic issue; no change from baseline in past 3 months  - Wounds cleaned and dressed  - PCP follow up strongly encouraged  I explained the diagnosis and have given precautions as to when/if to return to the ER including for any other new or worsening symptoms. Stressed importance of PCP follow up in the next 3 days.The patient and family understand and accept the medical plan as it's been dictated and I have answered their questions. Discharge instructions concerning home care have been given. The patient is stable and is discharged to home in good condition.  Patient seen by and  discussed with Dr. Donnald Garre who agrees with treatment plan.    [facility] Ecko Beasley, PA-C 08/04/15 1730  Arby Barrette, MD 08/10/15 [phone]

## 2015-08-04 NOTE — ED Notes (Addendum)
#  [id number] Allezyn Foam Dressing ordered from [employer]

## 2015-08-09 ENCOUNTER — Encounter (HOSPITAL_COMMUNITY): Payer: Self-pay

## 2015-08-09 ENCOUNTER — Inpatient Hospital Stay (HOSPITAL_COMMUNITY)
Admission: EM | Admit: 2015-08-09 | Discharge: 2015-08-11 | DRG: 291 | Disposition: A | Discharge status: Skilled Nursing Facility | Payer: Commercial Managed Care - HMO | Attending: Internal Medicine | Admitting: Internal Medicine

## 2015-08-09 ENCOUNTER — Emergency Department (HOSPITAL_COMMUNITY): Payer: Commercial Managed Care - HMO

## 2015-08-09 DIAGNOSIS — Z79891 Long term (current) use of opiate analgesic: Secondary | ICD-10-CM | POA: Diagnosis not present

## 2015-08-09 DIAGNOSIS — Z800 Family history of malignant neoplasm of digestive organs: Secondary | ICD-10-CM

## 2015-08-09 DIAGNOSIS — I38 Endocarditis, valve unspecified: Secondary | ICD-10-CM

## 2015-08-09 DIAGNOSIS — L03116 Cellulitis of left lower limb: Secondary | ICD-10-CM | POA: Diagnosis present

## 2015-08-09 DIAGNOSIS — I371 Nonrheumatic pulmonary valve insufficiency: Secondary | ICD-10-CM | POA: Diagnosis present

## 2015-08-09 DIAGNOSIS — R0602 Shortness of breath: Secondary | ICD-10-CM

## 2015-08-09 DIAGNOSIS — I4891 Unspecified atrial fibrillation: Secondary | ICD-10-CM | POA: Diagnosis not present

## 2015-08-09 DIAGNOSIS — S22000D Wedge compression fracture of unspecified thoracic vertebra, subsequent encounter for fracture with routine healing: Secondary | ICD-10-CM

## 2015-08-09 DIAGNOSIS — I110 Hypertensive heart disease with heart failure: Secondary | ICD-10-CM | POA: Diagnosis present

## 2015-08-09 DIAGNOSIS — L03115 Cellulitis of right lower limb: Secondary | ICD-10-CM | POA: Diagnosis present

## 2015-08-09 DIAGNOSIS — E7800 Pure hypercholesterolemia, unspecified: Secondary | ICD-10-CM | POA: Diagnosis present

## 2015-08-09 DIAGNOSIS — I5033 Acute on chronic diastolic (congestive) heart failure: Principal | ICD-10-CM | POA: Diagnosis present

## 2015-08-09 DIAGNOSIS — Z9841 Cataract extraction status, right eye: Secondary | ICD-10-CM | POA: Diagnosis not present

## 2015-08-09 DIAGNOSIS — Z961 Presence of intraocular lens: Secondary | ICD-10-CM | POA: Diagnosis present

## 2015-08-09 DIAGNOSIS — Z7982 Long term (current) use of aspirin: Secondary | ICD-10-CM | POA: Diagnosis not present

## 2015-08-09 DIAGNOSIS — Z9842 Cataract extraction status, left eye: Secondary | ICD-10-CM | POA: Diagnosis not present

## 2015-08-09 DIAGNOSIS — I081 Rheumatic disorders of both mitral and tricuspid valves: Secondary | ICD-10-CM | POA: Diagnosis present

## 2015-08-09 DIAGNOSIS — Z8619 Personal history of other infectious and parasitic diseases: Secondary | ICD-10-CM | POA: Diagnosis not present

## 2015-08-09 DIAGNOSIS — I482 Chronic atrial fibrillation, unspecified: Secondary | ICD-10-CM | POA: Diagnosis present

## 2015-08-09 DIAGNOSIS — J9621 Acute and chronic respiratory failure with hypoxia: Secondary | ICD-10-CM | POA: Diagnosis present

## 2015-08-09 DIAGNOSIS — Z79899 Other long term (current) drug therapy: Secondary | ICD-10-CM

## 2015-08-09 DIAGNOSIS — Z803 Family history of malignant neoplasm of breast: Secondary | ICD-10-CM | POA: Diagnosis not present

## 2015-08-09 DIAGNOSIS — E559 Vitamin D deficiency, unspecified: Secondary | ICD-10-CM | POA: Diagnosis present

## 2015-08-09 DIAGNOSIS — G20 Parkinson's disease: Secondary | ICD-10-CM | POA: Diagnosis present

## 2015-08-09 DIAGNOSIS — I509 Heart failure, unspecified: Secondary | ICD-10-CM

## 2015-08-09 DIAGNOSIS — I10 Essential (primary) hypertension: Secondary | ICD-10-CM | POA: Diagnosis present

## 2015-08-09 DIAGNOSIS — Z66 Do not resuscitate: Secondary | ICD-10-CM | POA: Diagnosis present

## 2015-08-09 DIAGNOSIS — Z823 Family history of stroke: Secondary | ICD-10-CM | POA: Diagnosis not present

## 2015-08-09 DIAGNOSIS — F0390 Unspecified dementia without behavioral disturbance: Secondary | ICD-10-CM | POA: Diagnosis present

## 2015-08-09 HISTORY — DX: Permanent atrial fibrillation: I48.21

## 2015-08-09 LAB — MRSA PCR SCREENING: MRSA by PCR: NEGATIVE

## 2015-08-09 LAB — I-STAT CHEM 8, ED
BUN: 16 mg/dL (ref 6–20)
CHLORIDE: 102 mmol/L (ref 101–111)
CREATININE: 0.6 mg/dL (ref 0.44–1.00)
Calcium, Ion: 1.17 mmol/L (ref 1.13–1.30)
GLUCOSE: 115 mg/dL — AB (ref 65–99)
HCT: 47 % — ABNORMAL HIGH (ref 36.0–46.0)
HEMOGLOBIN: 16 g/dL — AB (ref 12.0–15.0)
POTASSIUM: 4.3 mmol/L (ref 3.5–5.1)
Sodium: 138 mmol/L (ref 135–145)
TCO2: 29 mmol/L (ref 0–100)

## 2015-08-09 LAB — COMPREHENSIVE METABOLIC PANEL
ALBUMIN: 3.7 g/dL (ref 3.5–5.0)
ALT: 19 U/L (ref 14–54)
AST: 26 U/L (ref 15–41)
Alkaline Phosphatase: 108 U/L (ref 38–126)
Anion gap: 8 (ref 5–15)
BUN: 14 mg/dL (ref 6–20)
CHLORIDE: 103 mmol/L (ref 101–111)
CO2: 26 mmol/L (ref 22–32)
Calcium: 9.7 mg/dL (ref 8.9–10.3)
Creatinine, Ser: 0.65 mg/dL (ref 0.44–1.00)
GFR calc Af Amer: >60 mL/min (ref >60)
GLUCOSE: 114 mg/dL — AB (ref 65–99)
POTASSIUM: 4.2 mmol/L (ref 3.5–5.1)
SODIUM: 137 mmol/L (ref 135–145)
Total Bilirubin: 1 mg/dL (ref 0.3–1.2)
Total Protein: 7.4 g/dL (ref 6.5–8.1)

## 2015-08-09 LAB — CBC WITH DIFFERENTIAL/PLATELET
BASOS ABS: 0 10*3/uL (ref 0.0–0.1)
BASOS PCT: 0 %
EOS ABS: 0 10*3/uL (ref 0.0–0.7)
EOS PCT: 0 %
HCT: 44.1 % (ref 36.0–46.0)
Hemoglobin: 14.2 g/dL (ref 12.0–15.0)
Lymphocytes Relative: 14 %
Lymphs Abs: 1.4 10*3/uL (ref 0.7–4.0)
MCH: 30.4 pg (ref 26.0–34.0)
MCHC: 32.2 g/dL (ref 30.0–36.0)
MCV: 94.4 fL (ref 78.0–100.0)
MONO ABS: 0.8 10*3/uL (ref 0.1–1.0)
Monocytes Relative: 8 %
Neutro Abs: 7.6 10*3/uL (ref 1.7–7.7)
Neutrophils Relative %: 78 %
PLATELETS: 320 10*3/uL (ref 150–400)
RBC: 4.67 MIL/uL (ref 3.87–5.11)
RDW: 14.6 % (ref 11.5–15.5)
WBC: 9.9 10*3/uL (ref 4.0–10.5)

## 2015-08-09 LAB — BRAIN NATRIURETIC PEPTIDE: B NATRIURETIC PEPTIDE 5: 485.5 pg/mL — AB (ref 0.0–100.0)

## 2015-08-09 LAB — PROTIME-INR
INR: 1.1 (ref 0.00–1.49)
PROTHROMBIN TIME: 14.4 s (ref 11.6–15.2)

## 2015-08-09 LAB — URINALYSIS, ROUTINE W REFLEX MICROSCOPIC
Bilirubin Urine: NEGATIVE
GLUCOSE, UA: NEGATIVE mg/dL
HGB URINE DIPSTICK: NEGATIVE
Ketones, ur: NEGATIVE mg/dL
LEUKOCYTES UA: NEGATIVE
Nitrite: NEGATIVE
PH: 7 (ref 5.0–8.0)
Protein, ur: NEGATIVE mg/dL
SPECIFIC GRAVITY, URINE: 1.007 (ref 1.005–1.030)

## 2015-08-09 LAB — I-STAT CG4 LACTIC ACID, ED: LACTIC ACID, VENOUS: 1.23 mmol/L (ref 0.5–2.0)

## 2015-08-09 MED ORDER — VITAMIN D3 25 MCG (1000 UNIT) PO TABS
1000.0000 [IU] | ORAL_TABLET | Freq: Every day | ORAL | Status: DC
  Administered 2015-08-10 – 2015-08-11 (×2): 1000 [IU] via ORAL
  Filled 2015-08-09 (×2): qty 1

## 2015-08-09 MED ORDER — FUROSEMIDE 10 MG/ML IJ SOLN
40.0000 mg | Freq: Two times a day (BID) | INTRAMUSCULAR | Status: DC
Start: 2015-08-09 — End: 2015-08-11
  Administered 2015-08-09 – 2015-08-11 (×4): 40 mg via INTRAVENOUS
  Filled 2015-08-09 (×4): qty 4

## 2015-08-09 MED ORDER — SODIUM CHLORIDE 0.9 % IJ SOLN
3.0000 mL | Freq: Two times a day (BID) | INTRAMUSCULAR | Status: DC
  Administered 2015-08-10 – 2015-08-11 (×4): 3 mL via INTRAVENOUS

## 2015-08-09 MED ORDER — METOPROLOL TARTRATE 1 MG/ML IV SOLN
5.0000 mg | Freq: Once | INTRAVENOUS | Status: AC
  Administered 2015-08-09: 5 mg via INTRAVENOUS
  Filled 2015-08-09: qty 5

## 2015-08-09 MED ORDER — SODIUM CHLORIDE 0.9 % IJ SOLN: 3.0000 mL | INTRAMUSCULAR | Status: DC | PRN

## 2015-08-09 MED ORDER — LISINOPRIL 2.5 MG PO TABS
2.5000 mg | ORAL_TABLET | Freq: Every day | ORAL | Status: DC
  Administered 2015-08-09 – 2015-08-11 (×2): 2.5 mg via ORAL
  Filled 2015-08-09 (×3): qty 1

## 2015-08-09 MED ORDER — ASPIRIN EC 325 MG PO TBEC
325.0000 mg | DELAYED_RELEASE_TABLET | Freq: Every day | ORAL | Status: DC
  Administered 2015-08-09 – 2015-08-11 (×3): 325 mg via ORAL
  Filled 2015-08-09 (×3): qty 1

## 2015-08-09 MED ORDER — DILTIAZEM HCL 25 MG/5ML IV SOLN
10.0000 mg | Freq: Four times a day (QID) | INTRAVENOUS | Status: DC | PRN
  Administered 2015-08-09: 10 mg via INTRAVENOUS
  Filled 2015-08-09 (×2): qty 5

## 2015-08-09 MED ORDER — DILTIAZEM HCL 25 MG/5ML IV SOLN
15.0000 mg | Freq: Once | INTRAVENOUS | Status: DC
  Filled 2015-08-09: qty 5

## 2015-08-09 MED ORDER — ACETAMINOPHEN 325 MG PO TABS: 650.0000 mg | ORAL_TABLET | ORAL | Status: DC | PRN

## 2015-08-09 MED ORDER — ONDANSETRON HCL 4 MG/2ML IJ SOLN: 4.0000 mg | Freq: Four times a day (QID) | INTRAMUSCULAR | Status: DC | PRN

## 2015-08-09 MED ORDER — ALBUTEROL SULFATE (2.5 MG/3ML) 0.083% IN NEBU: 2.5000 mg | INHALATION_SOLUTION | RESPIRATORY_TRACT | Status: DC | PRN

## 2015-08-09 MED ORDER — NITROGLYCERIN 2 % TD OINT
0.5000 [in_us] | TOPICAL_OINTMENT | Freq: Four times a day (QID) | TRANSDERMAL | Status: DC
  Administered 2015-08-10 – 2015-08-11 (×5): 0.5 [in_us] via TOPICAL
  Filled 2015-08-09: qty 30

## 2015-08-09 MED ORDER — PRIMIDONE 50 MG PO TABS
100.0000 mg | ORAL_TABLET | Freq: Every day | ORAL | Status: DC
  Administered 2015-08-09 – 2015-08-10 (×2): 100 mg via ORAL
  Filled 2015-08-09 (×3): qty 2

## 2015-08-09 MED ORDER — FUROSEMIDE 10 MG/ML IJ SOLN
40.0000 mg | Freq: Once | INTRAMUSCULAR | Status: AC
  Administered 2015-08-09: 40 mg via INTRAVENOUS
  Filled 2015-08-09: qty 4

## 2015-08-09 MED ORDER — METOPROLOL TARTRATE 25 MG PO TABS
25.0000 mg | ORAL_TABLET | Freq: Two times a day (BID) | ORAL | Status: DC
  Administered 2015-08-09 (×2): 25 mg via ORAL
  Filled 2015-08-09 (×2): qty 1

## 2015-08-09 MED ORDER — ENOXAPARIN SODIUM 40 MG/0.4ML ~~LOC~~ SOLN
40.0000 mg | SUBCUTANEOUS | Status: DC
  Administered 2015-08-09 – 2015-08-10 (×2): 40 mg via SUBCUTANEOUS
  Filled 2015-08-09 (×3): qty 0.4

## 2015-08-09 MED ORDER — SODIUM CHLORIDE 0.9 % IV SOLN: 250.0000 mL | INTRAVENOUS | Status: DC | PRN

## 2015-08-09 MED ORDER — DOXYCYCLINE HYCLATE 100 MG PO TABS
100.0000 mg | ORAL_TABLET | Freq: Two times a day (BID) | ORAL | Status: DC
  Administered 2015-08-09 – 2015-08-11 (×4): 100 mg via ORAL
  Filled 2015-08-09 (×6): qty 1

## 2015-08-09 MED ORDER — PRIMIDONE 50 MG PO TABS
50.0000 mg | ORAL_TABLET | Freq: Every day | ORAL | Status: DC
  Filled 2015-08-09: qty 1

## 2015-08-09 MED ORDER — DONEPEZIL HCL 5 MG PO TABS
5.0000 mg | ORAL_TABLET | Freq: Every day | ORAL | Status: DC
  Administered 2015-08-09 – 2015-08-11 (×3): 5 mg via ORAL
  Filled 2015-08-09 (×3): qty 1

## 2015-08-09 NOTE — ED Provider Notes (Signed)
CSN: [id number]     Arrival date & time 08/09/15  1036 History   First MD Initiated Contact with Patient 08/09/15 1045     Chief Complaint  Patient presents with  . Shortness of Breath  . Tachycardia    HPI  Patient presents for evaluation via EMS.  Patient lives in independent living. Daughter ultimately arrives and states is been attempting to get her into assisted living because of increasing dementia. Has had worsening lower extremity edema and underwent an echocardiogram actually 6 weeks ago. Had preserved LV function.  Also has a history of chronic atrial fibrillation. Rate control with Lopressor, anticoagulated.  Increasing shortness of breath for the last 2 days. Was more confused and short of breath today. 88-90% on room air for paramedics. Was in rapid A. fib.  Past Medical History  Diagnosis Date  . Hypertension   . Tremor   . High cholesterol   . History of pelvic fracture 2011    "no OR"  . History of measles   . History of mumps   . A-fib (HCC)   . Parkinson disease (HCC)     pt denies this hx on 05/05/2015  . Family history of adverse reaction to anesthesia   . Arthritis     "thumbs" (05/05/2015)  . Chronic back pain greater than 3 months duration    Past Surgical History  Procedure Laterality Date  . Tonsillectomy  1934  . Cataract extraction w/ intraocular lens  implant, bilateral Bilateral    Family History  Problem Relation Age of Onset  . Stroke Father   . Breast cancer Mother   . Gout Son   . Heart Problems Son     Triple Bypass  . Cancer Maternal Grandmother 7070    breast cancer  . Cancer Maternal Grandfather 50    stomach cancer   Social History  Substance Use Topics  . Smoking status: Never Smoker   . Smokeless tobacco: Never Used  . Alcohol Use: No   OB History    No data available     Review of Systems  Constitutional: Negative for fever, chills, diaphoresis, appetite change and fatigue.  HENT: Negative for mouth sores, sore throat  and trouble swallowing.   Eyes: Negative for visual disturbance.  Respiratory: Positive for shortness of breath. Negative for cough, chest tightness and wheezing.   Cardiovascular: Positive for leg swelling. Negative for chest pain.  Gastrointestinal: Negative for nausea, vomiting, abdominal pain, diarrhea and abdominal distention.  Endocrine: Negative for polydipsia, polyphagia and polyuria.  Genitourinary: Negative for dysuria, frequency and hematuria.  Musculoskeletal: Negative for gait problem.  Skin: Negative for color change, pallor and rash.  Neurological: Negative for dizziness, syncope, light-headedness and headaches.  Hematological: Does not bruise/bleed easily.  Psychiatric/Behavioral: Negative for behavioral problems and confusion.      Allergies  Review of patient's allergies indicates no known allergies.  Home Medications   Prior to Admission medications   Medication Sig Start Date End Date Taking? Authorizing Provider  aspirin EC 325 MG tablet Take 1 tablet (325 mg total) by mouth daily. 11/03/13  Yes Mahima Pandey, MD  cephALEXin (KEFLEX) 500 MG capsule Take 500 mg by mouth daily.  07/27/15  Yes Historical Provider, MD  Cholecalciferol (VITAMIN D-3 PO) Take 1,000 Units by mouth daily.    Yes Historical Provider, MD  furosemide (LASIX) 20 MG tablet Take 1 tablet (20 mg total) by mouth daily. Patient taking differently: Take 40 mg by mouth daily.  04/24/15  Yes Elease Etienne, MD  metoprolol tartrate (LOPRESSOR) 25 MG tablet Take 0.5 tablets (12.5 mg total) by mouth 2 (two) times daily. Patient taking differently: Take 25 mg by mouth daily.  05/07/15  Yes Maryann Mikhail, DO  Omega-3 Fatty Acids (FISH OIL PO) Take 1,000 mg by mouth daily.    Yes Historical Provider, MD  POTASSIUM PO Take 1 tablet by mouth daily.   Yes Historical Provider, MD  primidone (MYSOLINE) 50 MG tablet Take two tablets by mouth once daily 01/19/15  Yes Sharon Seller, NP   benazepril-hydrochlorthiazide (LOTENSIN HCT) 20-25 MG per tablet Take 0.5 tablets by mouth daily. 05/07/15   Maryann Mikhail, DO  donepezil (ARICEPT) 5 MG tablet Take 5 mg by mouth daily. 07/23/15   Historical Provider, MD  doxycycline (VIBRA-TABS) 100 MG tablet Take 1 tablet (100 mg total) by mouth 2 (two) times daily. 05/07/15   Maryann Mikhail, DO  saccharomyces boulardii (FLORASTOR) 250 MG capsule Take 1 capsule (250 mg total) by mouth 2 (two) times daily. 05/07/15   Maryann Mikhail, DO  traMADol (ULTRAM) 50 MG tablet Take 1 tablet (50 mg total) by mouth every 8 (eight) hours as needed for moderate pain or severe pain. Patient taking differently: Take 50 mg by mouth at bedtime.  04/24/15   Elease Etienne, MD   BP 145/97 mmHg  Pulse 99  Temp(Src) 99.9 F (37.7 C) (Rectal)  Resp 25  Wt 105 lb (47.628 kg)  SpO2 98% Physical Exam  Constitutional: She is oriented to person, place, and time. She appears well-developed and well-nourished. She appears distressed.  Tachypneic  HENT:  Head: Normocephalic.  Eyes: Conjunctivae are normal. Pupils are equal, round, and reactive to light. No scleral icterus.  Neck: Normal range of motion. Neck supple. No thyromegaly present.  Cardiovascular: Normal rate and regular rhythm.  Exam reveals no gallop and no friction rub.   No murmur heard. Irregularly irregular. Rapid A. fib on the monitor rate 140.  Pulmonary/Chest: Effort normal and breath sounds normal. No respiratory distress. She has no wheezes. She has no rales.  Diffuse scattered rhonchi. Increased work of breathing.  Abdominal: Soft. Bowel sounds are normal. She exhibits no distension. There is no tenderness. There is no rebound.  Musculoskeletal: Normal range of motion.  Neurological: She is alert and oriented to person, place, and time.  Skin: Skin is warm and dry. No rash noted.  1-2+ symmetric lower extremity edema. Some wounds without obvious cellulitis.  Psychiatric: She has a normal  mood and affect. Her behavior is normal.    ED Course  Procedures (including critical care time) Labs Review Labs Reviewed  COMPREHENSIVE METABOLIC PANEL - Abnormal; Notable for the following:    Glucose, Bld 114 (*)    All other components within normal limits  BRAIN NATRIURETIC PEPTIDE - Abnormal; Notable for the following:    B Natriuretic Peptide 485.5 (*)    All other components within normal limits  I-STAT CHEM 8, ED - Abnormal; Notable for the following:    Glucose, Bld 115 (*)    Hemoglobin 16.0 (*)    HCT 47.0 (*)    All other components within normal limits  URINE CULTURE  CULTURE, BLOOD (ROUTINE X 2)  CULTURE, BLOOD (ROUTINE X 2)  CBC WITH DIFFERENTIAL/PLATELET  URINALYSIS, ROUTINE W REFLEX MICROSCOPIC (NOT AT [facility])  I-STAT CG4 LACTIC ACID, ED    Imaging Review Dg Chest Port 1 View  08/09/2015  CLINICAL DATA:  82 year old female with increased confusion  and shortness of breath. Atrial fibrillation and tachycardia. Initial encounter. EXAM: PORTABLE CHEST 1 VIEW COMPARISON:  08/04/2015 and earlier. FINDINGS: Portable AP semi upright view at 1117 hours. Continued bilateral pleural effusions greater on the right. That on the left may have mildly increased. Pulmonary vascularity has increased compatible with interstitial edema. Stable cardiomegaly and mediastinal contours. Tortuous calcified thoracic aorta. Increased right perihilar atelectasis. No pneumothorax. Osteopenia. Multilevel thoracic compression fractures appear grossly stable. IMPRESSION: 1. Acute pulmonary interstitial edema. Increased right lung atelectasis. 2. Continued bilateral pleural effusions greater on the right. 3. Cardiomegaly and calcified, tortuous thoracic aorta. Electronically Signed   By: Odessa Fleming M.D.   On: 08/09/2015 11:56   I have personally reviewed and evaluated these images and lab results as part of my medical decision-making.   EKG Interpretation   Date/Time:  Wednesday August 09 2015  10:51:44 EST Ventricular Rate:  146 PR Interval:    QRS Duration: 80 QT Interval:  292 QTC Calculation: 455 R Axis:   83 Text Interpretation:  Atrial fibrillation with rapid V-rate Borderline  right axis deviation Low voltage, extremity leads Repolarization  abnormality, prob rate related Confirmed by Fayrene Fearing  MD, Ariannie Penaloza ([id number]) on  08/09/2015 11:01:09 AM      MDM   Final diagnoses:  Congestive heart failure, unspecified congestive heart failure chronicity, unspecified congestive heart failure type (HCC)  Atrial fibrillation with rapid ventricular response (HCC)    Patient had improvement of her rate to simply with rest and O2. Given IV Lopressor 1 and has heart rate into the 80s. Chest x-ray shows CHF and bilateral effusions. Most recent echocardiogram shows LV function with EF of 55. Has diuresed. Still requiring 1 L nasal cannula to maintain greater than 90% O2. I will speak with hospitalist regarding admission.    Rolland Porter, MD 08/09/15 [phone]

## 2015-08-09 NOTE — ED Notes (Addendum)
Pt can go to floor at 16:22, Irving BurtonEmily

## 2015-08-09 NOTE — Consult Note (Signed)
CARDIOLOGY CONSULT NOTE       Patient ID: Alejandra Bailey MRN: [id number] DOB/AGE: [birth year] 81 y.o.  Admit date: 08/09/2015 Referring Physician:  Thedore Mins Primary Physician: Darrow Bussing, MD Primary Cardiologist:  None Reason for Consultation:  CHF/Rapid Afib  Principal Problem:   Acute on chronic diastolic congestive heart failure due to valvular disease (HCC) Active Problems:   Essential hypertension, benign   Vitamin D deficiency   Cellulitis   Chronic a-fib (HCC)   HPI:  81 y.o. with history of dementia, parkinson's HTN.  Has chronic afib not on anticoagulation due to age and frailty.  She was just moved to SNF and is a DNR.  1 day of increasing dyspnea And was brought to ER by her daughter in law.  Evaluation noted rapid afib, CHF with right pleural effusion. Sat's have responded to lasix and oxygen.  Less tachypnic and resting comfortable On 2L in bed now.  No chest pain palpitations or syncope. Has not been seen by a cardiologist before despite chronic afib.  No history of TIA/stroke despite not being on anticoagulation.  Denies cough fever sputum production  Echo reviewed from 06/21/15 done by primary for edema  Study Conclusions  - Left ventricle: The cavity size was normal. Systolic function was normal. The estimated ejection fraction was in the range of 55% to 60%. Wall motion was normal; there were no regional wall motion abnormalities. - Aortic valve: Trileaflet; mildly thickened, mildly calcified leaflets. - Mitral valve: There was mild regurgitation. - Left atrium: The atrium was severely dilated. - Right atrium: The atrium was moderately dilated. - Tricuspid valve: There was mild regurgitation. - Pulmonic valve: There was mild regurgitation. - Pulmonary arteries: PA peak pressure: 38 mm Hg (S).  ROS All other systems reviewed and negative except as noted above  Past Medical History  Diagnosis Date  . Hypertension   . Tremor   . High  cholesterol   . History of pelvic fracture 2011    "no OR"  . History of measles   . History of mumps   . A-fib (HCC)   . Parkinson disease (HCC)     pt denies this hx on 05/05/2015  . Family history of adverse reaction to anesthesia   . Arthritis     "thumbs" (05/05/2015)  . Chronic back pain greater than 3 months duration     Family History  Problem Relation Age of Onset  . Stroke Father   . Breast cancer Mother   . Gout Son   . Heart Problems Son     Triple Bypass  . Cancer Maternal Grandmother 75    breast cancer  . Cancer Maternal Grandfather 50    stomach cancer    Social History   Social History  . Marital Status: Widowed    Spouse Name: N/A  . Number of Children: N/A  . Years of Education: N/A   Occupational History  . Not on file.   Social History Main Topics  . Smoking status: Never Smoker   . Smokeless tobacco: Never Used  . Alcohol Use: No  . Drug Use: No  . Sexual Activity: No   Other Topics Concern  . Not on file   Social History Narrative    Past Surgical History  Procedure Laterality Date  . Tonsillectomy  1934  . Cataract extraction w/ intraocular lens  implant, bilateral Bilateral      . aspirin EC  325 mg Oral Daily  . [START ON 08/10/2015] cholecalciferol  1,000 Units Oral Daily  . donepezil  5 mg Oral Daily  . doxycycline  100 mg Oral Q12H  . enoxaparin (LOVENOX) injection  40 mg Subcutaneous Q24H  . furosemide  40 mg Intravenous BID  . lisinopril  2.5 mg Oral Daily  . metoprolol tartrate  25 mg Oral BID  . nitroGLYCERIN  0.5 inch Topical 4 times per day  . primidone  50 mg Oral QHS  . sodium chloride  3 mL Intravenous Q12H      Physical Exam: Blood pressure 123/83, pulse 61, temperature 99.9 F (37.7 C), temperature source Rectal, resp. rate 20, height 5' (1.524 m), weight 50.9 kg (112 lb 3.4 oz), SpO2 98 %.    Affect appropriate Frail elderly female  HEENT: normal Neck supple with no adenopathy JVP elevated  no bruits no  thyromegaly Lungs rales bilaterally base right worse than left  Heart:  S1/S2 no murmur, no rub, gallop or click PMI normal prominent superficial venous pattern on neck  Abdomen: benighn, BS positve, no tenderness, no AAA no bruit.  No HSM or HJR Distal pulses intact with no bruits No edema Neuro non-focal Skin warm and dry No muscular weakness   Labs:   Lab Results  Component Value Date   WBC 9.9 08/09/2015   HGB 14.2 08/09/2015   HCT 44.1 08/09/2015   MCV 94.4 08/09/2015   PLT 320 08/09/2015     Recent Labs Lab 08/09/15 1110  NA 137  K 4.2  CL 103  CO2 26  BUN 14  CREATININE 0.65  CALCIUM 9.7  PROT 7.4  BILITOT 1.0  ALKPHOS 108  ALT 19  AST 26  GLUCOSE 114*   No results found for: CKTOTAL, CKMB, CKMBINDEX, TROPONINI  Lab Results  Component Value Date   CHOL 256* 02/03/2014   CHOL 246* 11/03/2013   Lab Results  Component Value Date   HDL 76 02/03/2014   HDL 92 11/03/2013   Lab Results  Component Value Date   LDLCALC 165* 02/03/2014   LDLCALC 141* 11/03/2013   Lab Results  Component Value Date   TRIG 73 02/03/2014   TRIG 67 11/03/2013   Lab Results  Component Value Date   CHOLHDL 3.4 02/03/2014   CHOLHDL 2.7 11/03/2013   No results found for: LDLDIRECT    Radiology: Dg Chest 2 View  08/04/2015  CLINICAL DATA:  One week history of productive cough and congestion. EXAM: CHEST  2 VIEW COMPARISON:  May 05, 2015 FINDINGS: There is a moderate right pleural effusion with atelectasis and possibly consolidation in the right middle and lower lobes. Left lung is clear. Heart is upper normal in size with pulmonary vascularity within normal limits. Aorta is tortuous with atherosclerotic calcification. There is no demonstrable adenopathy. Bones diffusely osteoporotic with multiple compression fractures throughout the thoracic spine, a stable finding. There is thoracic and lumbar levoscoliosis. IMPRESSION: Moderate-sized right pleural effusion with right  middle and lower lobe atelectasis and possibly consolidation. Left lung clear. Heart size upper normal. Multiple compression fractures appear stable. Bones diffusely osteoporotic. Electronically Signed   By: Bretta Bailey III M.D.   On: 08/04/2015 13:39   Dg Chest Port 1 View  08/09/2015  CLINICAL DATA:  81 year old female with increased confusion and shortness of breath. Atrial fibrillation and tachycardia. Initial encounter. EXAM: PORTABLE CHEST 1 VIEW COMPARISON:  08/04/2015 and earlier. FINDINGS: Portable AP semi upright view at 1117 hours. Continued bilateral pleural effusions greater on the right. That on the left may have  mildly increased. Pulmonary vascularity has increased compatible with interstitial edema. Stable cardiomegaly and mediastinal contours. Tortuous calcified thoracic aorta. Increased right perihilar atelectasis. No pneumothorax. Osteopenia. Multilevel thoracic compression fractures appear grossly stable. IMPRESSION: 1. Acute pulmonary interstitial edema. Increased right lung atelectasis. 2. Continued bilateral pleural effusions greater on the right. 3. Cardiomegaly and calcified, tortuous thoracic aorta. Electronically Signed   By: Odessa FlemingH  Hall M.D.   On: 08/09/2015 11:56    EKG:  Rapid afib nonspecific ST/T wave changes    ASSESSMENT AND PLAN:  CHF:  Likely diastolic in setting of rapid afib, decreased diastolic filling time in elderly hypertensive female with stiff heart.  Agree with bid lopressor for rate control And diuresis Repeat CXR 2 days after diuresis to make sure no RLL infiltrate.    Afib:  Rate control with beta blocker no anticoagulation due to age and frailty  This patients CHA2DS2-VASc Score and unadjusted Ischemic Stroke Rate (% per year) is equal to 7.2 % stroke rate/year from a score of 5   Above score calculated as 1 point each if present [CHF, HTN, DM, Vascular=MI/PAD/Aortic Plaque, Age if 65-74, or Female] Above score calculated as 2 points each if  present [Age > 75, or Stroke/TIA/TE]  Neuro:  Continue aricept for dementia may help with rate control.  Continue primidone for parkinsons    Note advanced directives DNR Signed: Charlton Hawseter Alejandra Bailey 08/09/2015, 4:55 PM

## 2015-08-09 NOTE — H&P (Signed)
Patient Demographics:    Alejandra Bailey, is a 81 y.o. female  MRN: [id number]   DOB - [birth year]  Admit Date - 08/09/2015  Outpatient Primary MD for the patient is No primary care provider on file.   With History of -  Past Medical History  Diagnosis Date  . Hypertension   . Tremor   . High cholesterol   . History of pelvic fracture 2011    "no OR"  . History of measles   . History of mumps   . A-fib (HCC)   . Parkinson disease (HCC)     pt denies this hx on 05/05/2015  . Family history of adverse reaction to anesthesia   . Arthritis     "thumbs" (05/05/2015)  . Chronic back pain greater than 3 months duration       Past Surgical History  Procedure Laterality Date  . Tonsillectomy  1934  . Cataract extraction w/ intraocular lens  implant, bilateral Bilateral     in for   Chief Complaint  Patient presents with  . Shortness of Breath  . Tachycardia      HPI:    Alejandra Bailey  is a 81 y.o. female, history of chronic atrial fibrillation, Parkinson's disease, essential hypertension, mild dementia, history of pelvic fracture, who lives in assisted living facility and was in the process of transferring to a SNF is brought in by family members after she started complaining of some exertional shortness of breath and orthopnea for the last day. Does have a dry cough, no fever, no exposure to sick contacts, no chest pain. Denies any abdominal pain or diarrhea. Recently been diagnosed with bilateral lower extremity cellulitis.  In the ER workup suggestive of acute on chronic diastolic dysfunction, acute hypoxic respiratory failure with requirement of 2 L nasal cannula oxygen, she was 87% on room air, respiration rate 23/minute, atrial fibrillation with RVR, I was called to admit the patient.    Review of  systems:    In addition to the HPI above,   No Fever-chills, No Headache, No changes with Vision or hearing, No problems swallowing food or Liquids, No Chest pain, dry cough with positive shortness of breath and orthopnea, No Abdominal pain, No Nausea or Vommitting, Bowel movements are regular, No Blood in stool or Urine, No dysuria, No new skin rashes or bruises, No new joints pains-aches,  No new weakness, tingling, numbness in any extremity, No recent weight loss, positive weight gain and leg edema No polyuria, polydypsia or polyphagia, No significant Mental Stressors.  A full 10 point Review of Systems was done, except as stated above, all other Review of Systems were negative.    Social History:     Social History  Substance Use Topics  . Smoking status: Never Smoker   . Smokeless tobacco: Never Used  . Alcohol Use: No    Lives - at assisted living and being transferred to  a SNF, walks with a walker      Family History :     Family History  Problem Relation Age of Onset  . Stroke Father   . Breast cancer Mother   . Gout Son   . Heart Problems Son     Triple Bypass  . Cancer Maternal Grandmother 66    breast cancer  . Cancer Maternal Grandfather 50    stomach cancer       Home Medications:   Prior to Admission medications   Medication Sig Start Date End Date Taking? Authorizing Provider  aspirin EC 325 MG tablet Take 1 tablet (325 mg total) by mouth daily. 11/03/13  Yes Mahima Pandey, MD  cephALEXin (KEFLEX) 500 MG capsule Take 500 mg by mouth daily.  07/27/15  Yes Historical Provider, MD  Cholecalciferol (VITAMIN D-3 PO) Take 1,000 Units by mouth daily.    Yes Historical Provider, MD  furosemide (LASIX) 20 MG tablet Take 1 tablet (20 mg total) by mouth daily. Patient taking differently: Take 40 mg by mouth daily.  04/24/15  Yes Elease Etienne, MD  metoprolol tartrate (LOPRESSOR) 25 MG tablet Take 0.5 tablets (12.5 mg total) by mouth 2 (two) times  daily. Patient taking differently: Take 25 mg by mouth daily.  05/07/15  Yes Maryann Mikhail, DO  Omega-3 Fatty Acids (FISH OIL PO) Take 1,000 mg by mouth daily.    Yes Historical Provider, MD  POTASSIUM PO Take 1 tablet by mouth daily.   Yes Historical Provider, MD  primidone (MYSOLINE) 50 MG tablet Take two tablets by mouth once daily 01/19/15  Yes Sharon Seller, NP  benazepril-hydrochlorthiazide (LOTENSIN HCT) 20-25 MG per tablet Take 0.5 tablets by mouth daily. 05/07/15   Maryann Mikhail, DO  donepezil (ARICEPT) 5 MG tablet Take 5 mg by mouth daily. 07/23/15   Historical Provider, MD  doxycycline (VIBRA-TABS) 100 MG tablet Take 1 tablet (100 mg total) by mouth 2 (two) times daily. 05/07/15   Maryann Mikhail, DO  saccharomyces boulardii (FLORASTOR) 250 MG capsule Take 1 capsule (250 mg total) by mouth 2 (two) times daily. 05/07/15   Maryann Mikhail, DO  traMADol (ULTRAM) 50 MG tablet Take 1 tablet (50 mg total) by mouth every 8 (eight) hours as needed for moderate pain or severe pain. Patient taking differently: Take 50 mg by mouth at bedtime.  04/24/15   Elease Etienne, MD     Allergies:    No Known Allergies   Physical Exam:   Vitals  Blood pressure 139/97, pulse 91, temperature 99.9 F (37.7 C), temperature source Rectal, resp. rate 23, weight 47.628 kg (105 lb), SpO2 96 % with application of 2 L nasal cannula oxygen.   1. General frail elderly white female lying in bed in mild shortness of breath,  2. Normal affect and insight, Not Suicidal or Homicidal, Awake Alert, Oriented X 3.  3. No F.N deficits, ALL C.Nerves Intact, Strength 5/5 all 4 extremities, Sensation intact all 4 extremities, Plantars down going.  4. Ears and Eyes appear Normal, Conjunctivae clear, PERRLA. Moist Oral Mucosa.  5. Supple Neck, + JVD, No cervical lymphadenopathy appriciated, No Carotid Bruits.  6. Symmetrical Chest wall movement, Good air movement bilaterally, bibasilar rales  7. iRRR, No  Gallops, Rubs or Murmurs, No Parasternal Heave.  8. Positive Bowel Sounds, Abdomen Soft, No tenderness, No organomegaly appriciated,No rebound -guarding or rigidity.  9.  No Cyanosis, Normal Skin Turgor, No Skin Rash or Bruise. Does have 1+ bipedal edema with  mild cellulitis bilaterally in both legs  10. Good muscle tone,  joints appear normal , no effusions, Normal ROM.  11. No Palpable Lymph Nodes in Neck or Axillae      Data Review:    CBC  Recent Labs Lab 08/04/15 1251 08/04/15 1309 08/09/15 1107 08/09/15 1110  WBC 6.5  --   --  9.9  HGB 14.1 14.6 16.0* 14.2  HCT 43.7 43.0 47.0* 44.1  PLT 336  --   --  320  MCV 94.4  --   --  94.4  MCH 30.5  --   --  30.4  MCHC 32.3  --   --  32.2  RDW 14.6  --   --  14.6  LYMPHSABS 1.7  --   --  1.4  MONOABS 0.6  --   --  0.8  EOSABS 0.2  --   --  0.0  BASOSABS 0.0  --   --  0.0   ------------------------------------------------------------------------------------------------------------------  Chemistries   Recent Labs Lab 08/04/15 1309 08/09/15 1107 08/09/15 1110  NA 139 138 137  K 3.5 4.3 4.2  CL 98* 102 103  CO2  --   --  26  GLUCOSE 128* 115* 114*  BUN CREATININE 0.80 0.60 0.65  CALCIUM  --   --  9.7  AST  --   --  26  ALT  --   --  19  ALKPHOS  --   --  108  BILITOT  --   --  1.0   ------------------------------------------------------------------------------------------------------------------ estimated creatinine clearance is 33.6 mL/min (by C-G formula based on Cr of 0.65). ------------------------------------------------------------------------------------------------------------------ No results for input(s): TSH, T4TOTAL, T3FREE, THYROIDAB in the last 72 hours.  Invalid input(s): FREET3   Coagulation profile No results for input(s): INR, PROTIME in the last 168 hours. ------------------------------------------------------------------------------------------------------------------- No  results for input(s): DDIMER in the last 72 hours. -------------------------------------------------------------------------------------------------------------------  Cardiac Enzymes No results for input(s): CKMB, TROPONINI, MYOGLOBIN in the last 168 hours.  Invalid input(s): CK ------------------------------------------------------------------------------------------------------------------ Invalid input(s): POCBNP   ---------------------------------------------------------------------------------------------------------------  Urinalysis    Component Value Date/Time   COLORURINE YELLOW 08/09/2015 1428   APPEARANCEUR CLEAR 08/09/2015 1428   LABSPEC 1.007 08/09/2015 1428   PHURINE 7.0 08/09/2015 1428   GLUCOSEU NEGATIVE 08/09/2015 1428   HGBUR NEGATIVE 08/09/2015 1428   BILIRUBINUR NEGATIVE 08/09/2015 1428   KETONESUR NEGATIVE 08/09/2015 1428   PROTEINUR NEGATIVE 08/09/2015 1428   UROBILINOGEN 1.0 05/05/2015 1430   NITRITE NEGATIVE 08/09/2015 1428   LEUKOCYTESUR NEGATIVE 08/09/2015 1428    ----------------------------------------------------------------------------------------------------------------   Imaging Results:    Dg Chest Port 1 View  08/09/2015  CLINICAL DATA:  81 year old female with increased confusion and shortness of breath. Atrial fibrillation and tachycardia. Initial encounter. EXAM: PORTABLE CHEST 1 VIEW COMPARISON:  08/04/2015 and earlier. FINDINGS: Portable AP semi upright view at 1117 hours. Continued bilateral pleural effusions greater on the right. That on the left may have mildly increased. Pulmonary vascularity has increased compatible with interstitial edema. Stable cardiomegaly and mediastinal contours. Tortuous calcified thoracic aorta. Increased right perihilar atelectasis. No pneumothorax. Osteopenia. Multilevel thoracic compression fractures appear grossly stable. IMPRESSION: 1. Acute pulmonary interstitial edema. Increased right lung atelectasis. 2.  Continued bilateral pleural effusions greater on the right. 3. Cardiomegaly and calcified, tortuous thoracic aorta. Electronically Signed   By: Odessa Fleming M.D.   On: 08/09/2015 11:56    My personal review of EKG: Rhythm Afib, Rate  120s/min, non specific ST changes   Assessment & Plan:  1. Acute on chronic hypoxic respiratory failure due to acute on chronic diastolic CHF. EF 50% on recent echogram done a few months ago. Will be admitted to telemetry bed, salt and fluid restriction, IV Lasix and Nitropaste. Oxygen and nebulizer treatments as needed, Continue low-dose beta blocker and ACE inhibitor. Monitor intake and output, daily weight, cardiology consulted.   2. Minimal bilateral lower extremity cellulitis. Placed on doxycycline and monitor.   3. Chronic atrial fibrillation with RVR in the ER, [place] 2 score of 4 or above. Not anti-evaluation candidate due to high fall risk, currently on aspirin and beta blocker will be continued, goal will be rate controlled.   4. Mild dementia. At risk for delirium. Minimize narcotics and benzodiazepine. Feeding assistance and aspiration precautions.   5. Essential hypertension. Placed on beta blocker, low-dose ACE inhibitor along with nitro paste. Continue to monitor.   DVT Prophylaxis Lovenox   AM Labs Ordered, also please review Full Orders  Family Communication: Admission, patients condition and plan of care including tests being ordered have been discussed with the patient and daughter-in-law who indicate understanding and agree with the plan and Code Status.  Code Status DNR  Likely DC to  SNF  Condition Fair  Time spent in minutes : 35    SINGH,PRASHANT K M.D on 08/09/2015 at 3:37 PM  Between 7am to 7pm - Pager - [phone]  After 7pm go to [email or web address] - password [email or web address]  [facility] - Office  [phone]

## 2015-08-09 NOTE — ED Notes (Signed)
Pt is resident o [facility]. Caregiver called EMS when pt was found to have increased confusion and shortness of breath. Pt has history of dementia but caregiver reported to EMS that pt had increased confusion from baseline. Pt presented to ED with uncontrolled AFib and increased RR of 30 saturating 90% on room air. Pt is no apparent distress and no c/o pain. [facility] with diagnosis of cellulitis of lower extremities.

## 2015-08-09 NOTE — ED Notes (Signed)
Bed: WA20 Expected date:  Expected time:  Means of arrival:  Comments: EMS- 81yo F, SOB/increased confusion

## 2015-08-10 ENCOUNTER — Encounter (HOSPITAL_COMMUNITY): Payer: Self-pay | Admitting: Physician Assistant

## 2015-08-10 DIAGNOSIS — I4891 Unspecified atrial fibrillation: Secondary | ICD-10-CM

## 2015-08-10 LAB — BASIC METABOLIC PANEL
Anion gap: 9 (ref 5–15)
BUN: 19 mg/dL (ref 6–20)
CHLORIDE: 105 mmol/L (ref 101–111)
CO2: 27 mmol/L (ref 22–32)
CREATININE: 0.83 mg/dL (ref 0.44–1.00)
Calcium: 9.5 mg/dL (ref 8.9–10.3)
GFR calc Af Amer: >60 mL/min (ref >60)
GFR calc non Af Amer: >60 mL/min (ref >60)
GLUCOSE: 106 mg/dL — AB (ref 65–99)
POTASSIUM: 3.6 mmol/L (ref 3.5–5.1)
SODIUM: 141 mmol/L (ref 135–145)

## 2015-08-10 LAB — URINE CULTURE: Culture: NO GROWTH

## 2015-08-10 LAB — MAGNESIUM: Magnesium: 2 mg/dL (ref 1.7–2.4)

## 2015-08-10 LAB — BRAIN NATRIURETIC PEPTIDE: B Natriuretic Peptide: 587.9 pg/mL — ABNORMAL HIGH (ref 0.0–100.0)

## 2015-08-10 MED ORDER — METOPROLOL TARTRATE 25 MG PO TABS
25.0000 mg | ORAL_TABLET | Freq: Three times a day (TID) | ORAL | Status: DC
  Administered 2015-08-10 – 2015-08-11 (×3): 25 mg via ORAL
  Filled 2015-08-10 (×3): qty 1

## 2015-08-10 MED ORDER — POTASSIUM CHLORIDE CRYS ER 20 MEQ PO TBCR
40.0000 meq | EXTENDED_RELEASE_TABLET | Freq: Once | ORAL | Status: AC
  Administered 2015-08-10: 40 meq via ORAL
  Filled 2015-08-10: qty 2

## 2015-08-10 NOTE — Evaluation (Signed)
Physical Therapy Evaluation Patient Details Name: Alejandra Bailey MRN: [id number] DOB: Feb 18, 1923 Today's Date: 08/10/2015   History of Present Illness  81 yo female w/ hx afib (no anticoag 2nd fall risk), dementia, Parkinson's dz, HTN,  admitted 12/14 w/ SOB and diagnosed with acute on chronic hypoxic respiratory failure due to acute on chronic diastolic CHF  Clinical Impression  Pt admitted with above diagnosis. Pt currently with functional limitations due to the deficits listed below (see PT Problem List).   Pt slowly mobilizing and assisted OOB to recliner today.  Encouraged ambulation however pt did not feel able to tolerate today.  Pt will benefit from skilled PT to increase their independence and safety with mobility to allow discharge to the venue listed below.       Follow Up Recommendations SNF;Supervision/Assistance - 24 hour    Equipment Recommendations  None recommended by PT    Recommendations for Other Services       Precautions / Restrictions Precautions Precautions: Fall      Mobility  Bed Mobility Overal bed mobility: Needs Assistance Bed Mobility: Supine to Sit     Supine to sit: Mod assist;HOB elevated     General bed mobility comments: assist for trunk upright and scooting to EOB, utilized bed pads  Transfers Overall transfer level: Needs assistance Equipment used: Rolling walker (2 wheeled) Transfers: Sit to/from [employer] CorporationStand;Stand Pivot Transfers Sit to Stand: Min assist Stand pivot transfers: Min assist       General transfer comment: verbal cues for safe technique, assist to steady, pt took a few steps over to recliner, did not feel able to ambulate today  Ambulation/Gait                Stairs            Wheelchair Mobility    Modified Rankin (Stroke Patients Only)       Balance Overall balance assessment: Needs assistance         Standing balance support: Bilateral upper extremity supported Standing balance-Leahy Scale:  Poor                               Pertinent Vitals/Pain Pain Assessment: No/denies pain  Pt on 1L O2 [place] in room, removed during transfer however reapplied end of session.    Home Living   Living Arrangements: Alone   Type of Home: Independent living facility         Home Equipment: Walker - 2 wheels      Prior Function Level of Independence: Independent with assistive device(s)         Comments: per pt she ambulates with RW      Hand Dominance        Extremity/Trunk Assessment   Upper Extremity Assessment: Generalized weakness           Lower Extremity Assessment: Generalized weakness         Communication   Communication: HOH  Cognition Arousal/Alertness: Awake/alert Behavior During Therapy: WFL for tasks assessed/performed Overall Cognitive Status: No family/caregiver present to determine baseline cognitive functioning                      General Comments      Exercises        Assessment/Plan    PT Assessment Patient needs continued PT services  PT Diagnosis Difficulty walking;Generalized weakness   PT Problem List Decreased strength;Decreased activity tolerance;Decreased mobility;Decreased  knowledge of use of DME;Decreased skin integrity  PT Treatment Interventions DME instruction;Gait training;Functional mobility training;Patient/family education;Therapeutic activities;Therapeutic exercise;Balance training   PT Goals (Current goals can be found in the Care Plan section) Acute Rehab PT Goals Patient Stated Goal: return "home" as soon as possible PT Goal Formulation: With patient Time For Goal Achievement: 08/24/15 Potential to Achieve Goals: Good    Frequency Min 3X/week   Barriers to discharge        Co-evaluation               End of Session Equipment Utilized During Treatment: Gait belt Activity Tolerance: Patient limited by fatigue Patient left: with call bell/phone within reach;in chair;with  chair alarm set Nurse Communication: Mobility status         Time: 1001-1014 PT Time Calculation (min) (ACUTE ONLY): 13 min   Charges:   PT Evaluation $Initial PT Evaluation Tier I: 1 Procedure     PT G Codes:        Alejandra Bailey,Alejandra Bailey 08/10/2015, 11:21 AM Zenovia Jarred, PT, DPT 08/10/2015 Pager: [phone]

## 2015-08-10 NOTE — Progress Notes (Signed)
Patient Demographics:    Alejandra Bailey, is a 81 y.o. female, DOB - May 05, 1923, ZOX:[id number]  Admit date - 08/09/2015   Admitting Physician Leroy SeaPrashant K Ricky Gallery, MD  Outpatient Primary MD for the patient is Darrow BussingKOIRALA,DIBAS, MD  LOS - 1   Chief Complaint  Patient presents with  . Shortness of Breath  . Tachycardia        Subjective:    Caitlinn Gries today has, No headache, No chest pain, No abdominal pain - No Nausea, No new weakness tingling or numbness, No Cough - Improved SOB.    Assessment  & Plan :     1. Acute on chronic hypoxic respiratory failure due to acute on chronic diastolic CHF. EF 50% on recent echogram done a few months ago. Stable on telemetry bed, continue salt and fluid restriction, IV Lasix and Nitropaste. Oxygen and nebulizer treatments as needed, Continue low-dose beta blocker and ACE inhibitor. Monitor intake and output, daily weight, cardiology following.   2. Minimal bilateral lower extremity cellulitis. Placed on doxycycline and monitor.   3. Chronic atrial fibrillation with RVR in the ER, [place] 2 score [place] 5. Not anti-evaluation candidate due to high fall risk, currently on aspirin and beta blocker will be continued, goal will be rate controlled.   4. Mild dementia. At risk for delirium. Minimize narcotics and benzodiazepine. Feeding assistance and aspiration precautions.   5. Essential hypertension. Placed on beta blocker, low-dose ACE inhibitor along with nitro paste. Continue to monitor.     Code Status : DNR  Family Communication  : daughter in law  Disposition Plan  : SNF in am  Consults  : Cards  Procedures  :     DVT Prophylaxis  :  Lovenox   Lab Results  Component Value Date   PLT 320 08/09/2015    Inpatient Medications  Scheduled  Meds: . aspirin EC  325 mg Oral Daily  . cholecalciferol  1,000 Units Oral Daily  . donepezil  5 mg Oral Daily  . doxycycline  100 mg Oral Q12H  . enoxaparin (LOVENOX) injection  40 mg Subcutaneous Q24H  . furosemide  40 mg Intravenous BID  . lisinopril  2.5 mg Oral Daily  . metoprolol tartrate  25 mg Oral BID  . nitroGLYCERIN  0.5 inch Topical 4 times per day  . primidone  100 mg Oral QHS  . sodium chloride  3 mL Intravenous Q12H   Continuous Infusions:  PRN Meds:.sodium chloride, acetaminophen, albuterol, diltiazem, ondansetron (ZOFRAN) IV, sodium chloride  Antibiotics  :   Anti-infectives    Start     Dose/Rate Route Frequency Ordered Stop   08/09/15 2200  doxycycline (VIBRA-TABS) tablet 100 mg     100 mg Oral Every 12 hours 08/09/15 1648          Objective:   Filed Vitals:   08/09/15 1658 08/09/15 2107 08/10/15 0504 08/10/15 0514  BP: 148/98 121/74 129/82   Pulse: 73 83 92   Temp: 97.7 F (36.5 C) 98.1 F (36.7 C) 97.8 F (36.6 C)   TempSrc: Oral Oral Oral   Resp: 22 20 22    Height:      Weight:    50.7 kg (111 lb 12.4 oz)  SpO2: 97% 96%  94%     Wt Readings from Last 3 Encounters:  08/10/15 50.7 kg (111 lb 12.4 oz)  05/05/15 48.217 kg (106 lb 4.8 oz)  04/21/15 50.3 kg (110 lb 14.3 oz)     Intake/Output Summary (Last 24 hours) at 08/10/15 0804 Last data filed at 08/09/15 1752  Gross per 24 hour  Intake    240 ml  Output    350 ml  Net   -110 ml     Physical Exam  Awake Alert, Oriented X 3, No new F.N deficits, Normal affect [place].AT,PERRAL Supple Neck,No JVD, No cervical lymphadenopathy appriciated.  Symmetrical Chest wall movement, Good air movement bilaterally, few rales at the bases RRR,No Gallops,Rubs or new Murmurs, No Parasternal Heave +ve B.Sounds, Abd Soft, No tenderness, No organomegaly appriciated, No rebound - guarding or rigidity. No Cyanosis, Clubbing, trace edema, No new Rash or bruise       Data Review:   Micro Results Recent  Results (from the past 240 hour(s))  MRSA PCR Screening     Status: None   Collection Time: 08/09/15  6:29 PM  Result Value Ref Range Status   MRSA by PCR NEGATIVE NEGATIVE Final    Comment:        The GeneXpert MRSA Assay (FDA approved for NASAL specimens only), is one component of a comprehensive MRSA colonization surveillance program. It is not intended to diagnose MRSA infection nor to guide or monitor treatment for MRSA infections.     Radiology Reports Dg Chest 2 View  08/04/2015  CLINICAL DATA:  One week history of productive cough and congestion. EXAM: CHEST  2 VIEW COMPARISON:  May 05, 2015 FINDINGS: There is a moderate right pleural effusion with atelectasis and possibly consolidation in the right middle and lower lobes. Left lung is clear. Heart is upper normal in size with pulmonary vascularity within normal limits. Aorta is tortuous with atherosclerotic calcification. There is no demonstrable adenopathy. Bones diffusely osteoporotic with multiple compression fractures throughout the thoracic spine, a stable finding. There is thoracic and lumbar levoscoliosis. IMPRESSION: Moderate-sized right pleural effusion with right middle and lower lobe atelectasis and possibly consolidation. Left lung clear. Heart size upper normal. Multiple compression fractures appear stable. Bones diffusely osteoporotic. Electronically Signed   By: Bretta Bang III M.D.   On: 08/04/2015 13:39   Dg Chest Port 1 View  08/09/2015  CLINICAL DATA:  81 year old female with increased confusion and shortness of breath. Atrial fibrillation and tachycardia. Initial encounter. EXAM: PORTABLE CHEST 1 VIEW COMPARISON:  08/04/2015 and earlier. FINDINGS: Portable AP semi upright view at 1117 hours. Continued bilateral pleural effusions greater on the right. That on the left may have mildly increased. Pulmonary vascularity has increased compatible with interstitial edema. Stable cardiomegaly and mediastinal  contours. Tortuous calcified thoracic aorta. Increased right perihilar atelectasis. No pneumothorax. Osteopenia. Multilevel thoracic compression fractures appear grossly stable. IMPRESSION: 1. Acute pulmonary interstitial edema. Increased right lung atelectasis. 2. Continued bilateral pleural effusions greater on the right. 3. Cardiomegaly and calcified, tortuous thoracic aorta. Electronically Signed   By: Odessa Fleming M.D.   On: 08/09/2015 11:56     CBC  Recent Labs Lab 08/04/15 1251 08/04/15 1309 08/09/15 1107 08/09/15 1110  WBC 6.5  --   --  9.9  HGB 14.1 14.6 16.0* 14.2  HCT 43.7 43.0 47.0* 44.1  PLT 336  --   --  320  MCV 94.4  --   --  94.4  MCH 30.5  --   --  30.4  MCHC 32.3  --   --  32.2  RDW 14.6  --   --  14.6  LYMPHSABS 1.7  --   --  1.4  MONOABS 0.6  --   --  0.8  EOSABS 0.2  --   --  0.0  BASOSABS 0.0  --   --  0.0    Chemistries   Recent Labs Lab 08/04/15 1309 08/09/15 1107 08/09/15 1110 08/10/15 0305  NA 139 138 137 141  K 3.5 4.3 4.2 3.6  CL 98* 102 103 105  CO2  --   --  26 27  GLUCOSE 128* 115* 114* 106*  BUN CREATININE 0.80 0.60 0.65 0.83  CALCIUM  --   --  9.7 9.5  MG  --   --   --  2.0  AST  --   --  26  --   ALT  --   --  19  --   ALKPHOS  --   --  108  --   BILITOT  --   --  1.0  --    ------------------------------------------------------------------------------------------------------------------ estimated creatinine clearance is 32.4 mL/min (by C-G formula based on Cr of 0.83). ------------------------------------------------------------------------------------------------------------------ No results for input(s): HGBA1C in the last 72 hours. ------------------------------------------------------------------------------------------------------------------ No results for input(s): CHOL, HDL, LDLCALC, TRIG, CHOLHDL, LDLDIRECT in the last 72  hours. ------------------------------------------------------------------------------------------------------------------ No results for input(s): TSH, T4TOTAL, T3FREE, THYROIDAB in the last 72 hours.  Invalid input(s): FREET3 ------------------------------------------------------------------------------------------------------------------ No results for input(s): VITAMINB12, FOLATE, FERRITIN, TIBC, IRON, RETICCTPCT in the last 72 hours.  Coagulation profile  Recent Labs Lab 08/09/15 1730  INR 1.10    No results for input(s): DDIMER in the last 72 hours.  Cardiac Enzymes No results for input(s): CKMB, TROPONINI, MYOGLOBIN in the last 168 hours.  Invalid input(s): CK ------------------------------------------------------------------------------------------------------------------ Invalid input(s): POCBNP   Time Spent in minutes   35   Dan Scearce K M.D on 08/10/2015 at 8:04 AM  Between 7am to 7pm - Pager - [phone]  After 7pm go to [email or web address] - password [facility]  [facility] -  Office  [phone]

## 2015-08-10 NOTE — Progress Notes (Signed)
[facility]  Patient Status: Active (receiving services up to time of hospitalization)  [facility] is providing the following services: RN, PT, ST and MSW  If patient discharges after hours, please call [phone].   Avie EchevariaKaren Nussbaum 08/10/2015, 4:43 PM

## 2015-08-10 NOTE — Progress Notes (Signed)
Patient Name: Alejandra Bailey Date of Encounter: 08/10/2015  Principal Problem:   Acute on chronic diastolic congestive heart failure due to valvular disease (HCC) Active Problems:   Essential hypertension, benign   Vitamin D deficiency   Cellulitis   Chronic a-fib (HCC) - CHADS2VASC=5, no anticoag 2nd fall risk   Primary Cardiologist: Dr Eden Emms  Patient Profile: 81 yo female w/ hx afib (no anticoag 2nd fall risk), dementia, Parkinson's dz, HTN, DNR, admitted 12/14 w/ SOB. EF 60% by echo 05/2015  SUBJECTIVE: SOB w/ conversation, no acute complaints, feels she is doing a little better  OBJECTIVE Filed Vitals:   08/09/15 1658 08/09/15 2107 08/10/15 0504 08/10/15 0514  BP: 148/98 121/74 129/82   Pulse: 73 83 92   Temp: 97.7 F (36.5 C) 98.1 F (36.7 C) 97.8 F (36.6 C)   TempSrc: Oral Oral Oral   Resp: Height:      Weight:    111 lb 12.4 oz (50.7 kg)  SpO2: 97% 96% 94%     Intake/Output Summary (Last 24 hours) at 08/10/15 0825 Last data filed at 08/09/15 1752  Gross per 24 hour  Intake    240 ml  Output    350 ml  Net   -110 ml   Filed Weights   08/09/15 1106 08/09/15 1650 08/10/15 0514  Weight: 105 lb (47.628 kg) 112 lb 3.4 oz (50.9 kg) 111 lb 12.4 oz (50.7 kg)    PHYSICAL EXAM General: Well developed, well nourished, female in no acute distress. Head: Normocephalic, atraumatic.  Neck: Supple without bruits, JVD 10 cm, +hepatojugular reflux. Lungs:  Resp regular and unlabored, rales bases. Heart: Irreg irreg, S1, S2, no S3, S4, or murmur; no rub. Abdomen: Soft, non-tender, non-distended, BS + x 4.  Extremities: No clubbing, cyanosis, edema.  Neuro: Alert and oriented X 2.  Moves all extremities spontaneously. Psych: Normal affect.  LABS: CBC:  Recent Labs  08/09/15 1107 08/09/15 1110  WBC  --  9.9  NEUTROABS  --  7.6  HGB 16.0* 14.2  HCT 47.0* 44.1  MCV  --  94.4  PLT  --  320   INR:  Recent Labs  08/09/15 1730  INR 1.10    Basic Metabolic Panel:  Recent Labs  16/10/96 1110 08/10/15 0305  NA 137 141  K 4.2 3.6  CL 103 105  CO2 26 27  GLUCOSE 114* 106*  BUN 14 19  CREATININE 0.65 0.83  CALCIUM 9.7 9.5  MG  --  2.0   Liver Function Tests:  Recent Labs  08/09/15 1110  AST 26  ALT 19  ALKPHOS 108  BILITOT 1.0  PROT 7.4  ALBUMIN 3.7   BNP:  B NATRIURETIC PEPTIDE  Date/Time Value Ref Range Status  08/10/2015 03:05 AM 587.9* 0.0 - 100.0 pg/mL Final  08/09/2015 11:10 AM 485.5* 0.0 - 100.0 pg/mL Final   TELE:   Atrial fib, RVR at times     Radiology/Studies: Dg Chest Port 1 View  08/09/2015  CLINICAL DATA:  81 year old female with increased confusion and shortness of breath. Atrial fibrillation and tachycardia. Initial encounter. EXAM: PORTABLE CHEST 1 VIEW COMPARISON:  08/04/2015 and earlier. FINDINGS: Portable AP semi upright view at 1117 hours. Continued bilateral pleural effusions greater on the right. That on the left may have mildly increased. Pulmonary vascularity has increased compatible with interstitial edema. Stable cardiomegaly and mediastinal contours. Tortuous calcified thoracic aorta. Increased right perihilar atelectasis. No pneumothorax. Osteopenia. Multilevel  thoracic compression fractures appear grossly stable. IMPRESSION: 1. Acute pulmonary interstitial edema. Increased right lung atelectasis. 2. Continued bilateral pleural effusions greater on the right. 3. Cardiomegaly and calcified, tortuous thoracic aorta. Electronically Signed   By: Odessa FlemingH  Hall M.D.   On: 08/09/2015 11:56    Current Medications:  . aspirin EC  325 mg Oral Daily  . cholecalciferol  1,000 Units Oral Daily  . donepezil  5 mg Oral Daily  . doxycycline  100 mg Oral Q12H  . enoxaparin (LOVENOX) injection  40 mg Subcutaneous Q24H  . furosemide  40 mg Intravenous BID  . lisinopril  2.5 mg Oral Daily  . metoprolol tartrate  25 mg Oral BID  . nitroGLYCERIN  0.5 inch Topical 4 times per day  . potassium chloride   40 mEq Oral Once  . primidone  100 mg Oral QHS  . sodium chloride  3 mL Intravenous Q12H      ASSESSMENT AND PLAN: Principal Problem:   Acute on chronic diastolic congestive heart failure due to valvular disease (HCC) - continue IV Lasix for now, daily BMET, I/O, weights - recheck CXR in am  Otherwise, per IM, HR is OK on Lopressor. Active Problems:   Essential hypertension, benign   Vitamin D deficiency   Cellulitis   Chronic a-fib (HCC) - CHADS2VASC=5, no anticoag 2nd fall risk   Signed, Theodore DemarkBarrett, Rhonda , PA-C 8:25 AM 08/10/2015   Patient examined chart reviewed.  Lungs with improved aeration afib rate variable but better than ER yesterday Continue rate control with lopressor increase dose tid.  Lasix with marginally negative I/O's  F/u CXR in 48 hrs  Charlton HawsPeter Talah Cookston

## 2015-08-10 NOTE — NC FL2 (Signed)
[place] MEDICAID FL2 LEVEL OF CARE SCREENING TOOL     IDENTIFICATION  Patient Name: Alejandra Bailey Birthdate: 1923-01-29 Sex: female Admission Date (Current Location): 08/09/2015  [facility] and [place] Number: Producer, television/film/video and Address:  [facility],  [address] [place] [address]      Provider Number: [id number]  Attending Physician Name and Address:  Leroy Sea, MD  Relative Name and Phone Number:       Current Level of Care: Hospital Recommended Level of Care: Skilled Nursing Facility Prior Approval Number:    Date Approved/Denied:   PASRR Number: [id number]  Discharge Plan: SNF    Current Diagnoses: Patient Active Problem List   Diagnosis Date Noted  . Atrial fibrillation with rapid ventricular response (HCC)   . Acute on chronic diastolic congestive heart failure due to valvular disease (HCC) 08/09/2015  . Altered mental state 05/05/2015  . Pressure ulcer 04/22/2015  . Cellulitis 04/21/2015  . Hypokalemia 04/21/2015  . Cellulitis of left leg   . Chronic a-fib (HCC) - CHADS2VASC=5, no anticoag 2nd fall risk   . Senile osteoporosis 03/23/2015  . Chronic constipation 03/23/2015  . Thoracic compression fracture (HCC) 03/23/2015  . Acute suppurative otitis media with spontaneous rupture of eardrum 05/24/2014  . Otalgia of both ears 05/24/2014  . Essential hypertension, benign 11/03/2013  . A-fib (HCC) 11/03/2013  . Edema 11/03/2013  . Vitamin D deficiency 11/03/2013  . Other and unspecified hyperlipidemia 11/03/2013  . Tremors of nervous system 11/03/2013    Orientation RESPIRATION BLADDER Height & Weight    Self, Time, Situation, Place  O2 (2L) Continent 5' (152.4 cm) 112 lbs.  BEHAVIORAL SYMPTOMS/MOOD NEUROLOGICAL BOWEL NUTRITION STATUS      Continent  (heart healthy)  AMBULATORY STATUS COMMUNICATION OF NEEDS Skin   Limited Assist Verbally Normal                       Personal Care Assistance Level of  Assistance  Bathing, Dressing Bathing Assistance: Limited assistance   Dressing Assistance: Limited assistance     Functional Limitations Info             SPECIAL CARE FACTORS FREQUENCY  PT (By licensed PT), OT (By licensed OT)     PT Frequency: 5 OT Frequency: 5            Contractures      Additional Factors Info  Code Status Code Status Info: DNR Allergies Info: NKDA           Current Medications (08/10/2015):  This is the current hospital active medication list Current Facility-Administered Medications  Medication Dose Route Frequency Provider Last Rate Last Dose  . 0.9 %  sodium chloride infusion  250 mL Intravenous PRN Leroy Sea, MD      . acetaminophen (TYLENOL) tablet 650 mg  650 mg Oral Q4H PRN Leroy Sea, MD      . albuterol (PROVENTIL) (2.5 MG/3ML) 0.083% nebulizer solution 2.5 mg  2.5 mg Nebulization Q4H PRN Leroy Sea, MD      . aspirin EC tablet 325 mg  325 mg Oral Daily Leroy Sea, MD   325 mg at 08/09/15 1805  . cholecalciferol (VITAMIN D) tablet 1,000 Units  1,000 Units Oral Daily Leroy Sea, MD      . diltiazem (CARDIZEM) injection 10 mg  10 mg Intravenous Q6H PRN Leroy Sea, MD   10 mg at 08/09/15 1542  .  donepezil (ARICEPT) tablet 5 mg  5 mg Oral Daily Leroy SeaPrashant K Singh, MD   5 mg at 08/09/15 1805  . doxycycline (VIBRA-TABS) tablet 100 mg  100 mg Oral Q12H Leroy SeaPrashant K Singh, MD   100 mg at 08/09/15 2242  . enoxaparin (LOVENOX) injection 40 mg  40 mg Subcutaneous Q24H Leroy SeaPrashant K Singh, MD   40 mg at 08/09/15 1805  . furosemide (LASIX) injection 40 mg  40 mg Intravenous BID Leroy SeaPrashant K Singh, MD   40 mg at 08/10/15 0839  . lisinopril (PRINIVIL,ZESTRIL) tablet 2.5 mg  2.5 mg Oral Daily Leroy SeaPrashant K Singh, MD   2.5 mg at 08/09/15 1805  . metoprolol tartrate (LOPRESSOR) tablet 25 mg  25 mg Oral TID Wendall StadePeter C Nishan, MD      . nitroGLYCERIN (NITROGLYN) 2 % ointment 0.5 inch  0.5 inch Topical 4 times per day Leroy SeaPrashant K Singh,  MD   0.5 inch at 08/10/15 0640  . ondansetron (ZOFRAN) injection 4 mg  4 mg Intravenous Q6H PRN Leroy SeaPrashant K Singh, MD      . primidone (MYSOLINE) tablet 100 mg  100 mg Oral QHS Leroy SeaPrashant K Singh, MD   100 mg at 08/09/15 2242  . sodium chloride 0.9 % injection 3 mL  3 mL Intravenous Q12H Leroy SeaPrashant K Singh, MD   3 mL at 08/10/15 0150  . sodium chloride 0.9 % injection 3 mL  3 mL Intravenous PRN Leroy SeaPrashant K Singh, MD         Discharge Medications: Please see discharge summary for a list of discharge medications.  Relevant Imaging Results:  Relevant Lab Results:   Additional Information SSN [id number]  Liliana ClineCaldwell, Taquanna Borras Parks, LCSW

## 2015-08-10 NOTE — Clinical Social Work Placement (Signed)
   CLINICAL SOCIAL WORK PLACEMENT  NOTE  Date:  08/10/2015  Patient Details  Name: Cathi RoanViolet G Delcarlo MRN: [id number] Date of Birth: 12/11/1922  Clinical Social Work is seeking post-discharge placement for this patient at the Skilled  Nursing Facility level of care (*CSW will initial, date and re-position this form in  chart as items are completed):  Yes   Patient/family provided with [facility] Clinical Social Work Department's list of facilities offering this level of care within the geographic area requested by the patient (or if unable, by the patient's family).  Yes   Patient/family informed of their freedom to choose among providers that offer the needed level of care, that participate in Medicare, Medicaid or managed care program needed by the patient, have an available bed and are willing to accept the patient.  Yes   Patient/family informed of [facility]'s ownership interest in [facility] and [facility], as well as of the fact that they are under no obligation to receive care at these facilities.  PASRR submitted to EDS on       PASRR number received on       Existing PASRR number confirmed on 08/10/15     FL2 transmitted to all facilities in geographic area requested by pt/family on 08/10/15     FL2 transmitted to all facilities within larger geographic area on       Patient informed that his/her managed care company has contracts with or will negotiate with certain facilities, including the following:            Patient/family informed of bed offers received.  Patient chooses bed at       Physician recommends and patient chooses bed at      Patient to be transferred to   on  .  Patient to be transferred to facility by       Patient family notified on   of transfer.  Name of family member notified:        PHYSICIAN Please sign DNR, Please prepare priority discharge summary, including medications, Please sign FL2, Please prepare prescriptions      Additional Comment:    _______________________________________________ Liliana Clinealdwell, Chenita Ruda Parks, LCSW 08/10/2015, 12:21 PM

## 2015-08-11 ENCOUNTER — Inpatient Hospital Stay (HOSPITAL_COMMUNITY): Payer: Commercial Managed Care - HMO

## 2015-08-11 LAB — BASIC METABOLIC PANEL
ANION GAP: 10 (ref 5–15)
BUN: 22 mg/dL — AB (ref 6–20)
CO2: 26 mmol/L (ref 22–32)
Calcium: 9.1 mg/dL (ref 8.9–10.3)
Chloride: 102 mmol/L (ref 101–111)
Creatinine, Ser: 0.75 mg/dL (ref 0.44–1.00)
GFR calc Af Amer: >60 mL/min (ref >60)
Glucose, Bld: 98 mg/dL (ref 65–99)
POTASSIUM: 3.9 mmol/L (ref 3.5–5.1)
Sodium: 138 mmol/L (ref 135–145)

## 2015-08-11 LAB — MAGNESIUM: MAGNESIUM: 1.9 mg/dL (ref 1.7–2.4)

## 2015-08-11 MED ORDER — DOXYCYCLINE HYCLATE 100 MG PO TABS: 100.0000 mg | ORAL_TABLET | Freq: Two times a day (BID) | ORAL | Status: DC

## 2015-08-11 MED ORDER — ISOSORBIDE MONONITRATE ER 30 MG PO TB24
30.0000 mg | ORAL_TABLET | Freq: Every day | ORAL | Status: DC
  Filled 2015-08-11: qty 1

## 2015-08-11 MED ORDER — METOPROLOL TARTRATE 25 MG PO TABS: 25.0000 mg | ORAL_TABLET | Freq: Two times a day (BID) | ORAL | Status: AC

## 2015-08-11 MED ORDER — FUROSEMIDE 20 MG PO TABS: 40.0000 mg | ORAL_TABLET | Freq: Two times a day (BID) | ORAL | Status: DC

## 2015-08-11 MED ORDER — LISINOPRIL 2.5 MG PO TABS: 2.5000 mg | ORAL_TABLET | Freq: Every day | ORAL | Status: DC

## 2015-08-11 MED ORDER — ISOSORBIDE MONONITRATE ER 30 MG PO TB24: 30.0000 mg | ORAL_TABLET | Freq: Every day | ORAL | Status: AC

## 2015-08-11 NOTE — Progress Notes (Signed)
Patient Name: Alejandra Bailey Date of Encounter: 08/11/2015  Principal Problem:   Acute on chronic diastolic congestive heart failure due to valvular disease (HCC) Active Problems:   Essential hypertension, benign   Vitamin D deficiency   Cellulitis   Chronic a-fib (HCC) - CHADS2VASC=5, no anticoag 2nd fall risk   Atrial fibrillation with rapid ventricular response [facility])   Primary Cardiologist: Dr Eden Emms  Patient Profile: 81 yo female w/ hx afib (no anticoag 2nd fall risk), dementia, Parkinson's dz, HTN, DNR, admitted 12/14 w/ SOB. EF 60% by echo 05/2015  SUBJECTIVE: Breathing OK, says IM wants her to go to rehab. No chest pain or palpitations.  OBJECTIVE Filed Vitals:   08/10/15 1700 08/10/15 2055 08/11/15 0424 08/11/15 0518  BP: 128/68 114/75  131/75  Pulse: 92 77  81  Temp:  97.9 F (36.6 C)  97.8 F (36.6 C)  TempSrc:  Oral  Oral  Resp:  22  24  Height:      Weight:   111 lb 8.8 oz (50.6 kg)   SpO2:  95%  94%    Intake/Output Summary (Last 24 hours) at 08/11/15 0746 Last data filed at 08/10/15 2053  Gross per 24 hour  Intake    840 ml  Output    550 ml  Net    290 ml   Filed Weights   08/09/15 1650 08/10/15 0514 08/11/15 0424  Weight: 112 lb 3.4 oz (50.9 kg) 111 lb 12.4 oz (50.7 kg) 111 lb 8.8 oz (50.6 kg)    PHYSICAL EXAM General: Well developed, well nourished, female in no acute distress. Head: Normocephalic, atraumatic.  Neck: Supple without bruits, JVD 8-9 cm. Lungs:  Resp regular and unlabored, fine rales w/ decreased BS bases. Heart: Irreg irreg, S1, S2, no S3, S4, 2/6 murmur; no rub. Abdomen: Soft, non-tender, non-distended, BS + x 4.  Extremities: No clubbing, cyanosis, edema.  Neuro: Alert and oriented X 2. Moves all extremities spontaneously. Psych: Normal affect.  LABS: CBC: Recent Labs  08/09/15 1107 08/09/15 1110  WBC  --  9.9  NEUTROABS  --  7.6  HGB 16.0* 14.2  HCT 47.0* 44.1  MCV  --  94.4  PLT  --  320   INR: Recent  Labs  08/09/15 1730  INR 1.10   Basic Metabolic Panel: Recent Labs  08/10/15 0305 08/11/15 0427  NA 141 138  K 3.6 3.9  CL 105 102  CO2 27 26  GLUCOSE 106* 98  BUN 19 22*  CREATININE 0.83 0.75  CALCIUM 9.5 9.1  MG 2.0 1.9   Liver Function Tests: Recent Labs  08/09/15 1110  AST 26  ALT 19  ALKPHOS 108  BILITOT 1.0  PROT 7.4  ALBUMIN 3.7   BNP:  B NATRIURETIC PEPTIDE  Date/Time Value Ref Range Status  08/10/2015 03:05 AM 587.9* 0.0 - 100.0 pg/mL Final  08/09/2015 11:10 AM 485.5* 0.0 - 100.0 pg/mL Final   TELE:  Atrial fib, RVR at times      Radiology/Studies: Dg Chest Port 1 View 08/09/2015  CLINICAL DATA:  81 year old female with increased confusion and shortness of breath. Atrial fibrillation and tachycardia. Initial encounter. EXAM: PORTABLE CHEST 1 VIEW COMPARISON:  08/04/2015 and earlier. FINDINGS: Portable AP semi upright view at 1117 hours. Continued bilateral pleural effusions greater on the right. That on the left may have mildly increased. Pulmonary vascularity has increased compatible with interstitial edema. Stable cardiomegaly and mediastinal contours. Tortuous calcified thoracic aorta. Increased right perihilar  atelectasis. No pneumothorax. Osteopenia. Multilevel thoracic compression fractures appear grossly stable. IMPRESSION: 1. Acute pulmonary interstitial edema. Increased right lung atelectasis. 2. Continued bilateral pleural effusions greater on the right. 3. Cardiomegaly and calcified, tortuous thoracic aorta. Electronically Signed   By: Odessa FlemingH  Hall M.D.   On: 08/09/2015 11:56     Current Medications:  . aspirin EC  325 mg Oral Daily  . cholecalciferol  1,000 Units Oral Daily  . donepezil  5 mg Oral Daily  . doxycycline  100 mg Oral Q12H  . enoxaparin (LOVENOX) injection  40 mg Subcutaneous Q24H  . furosemide  40 mg Intravenous BID  . lisinopril  2.5 mg Oral Daily  . metoprolol tartrate  25 mg Oral TID  . nitroGLYCERIN  0.5 inch Topical 4 times per  day  . primidone  100 mg Oral QHS  . sodium chloride  3 mL Intravenous Q12H      ASSESSMENT AND PLAN: Principal Problem:  Acute on chronic diastolic congestive heart failure due to valvular disease (HCC) - continue daily weights - she is for d/c today to rehab - nitro paste changed to Imdur 30 - continue Lasix as PO at 40 mg qd  Otherwise, per IM, HR is OK on Lopressor. Active Problems:  Essential hypertension, benign  Vitamin D deficiency  Cellulitis  Chronic a-fib (HCC) - CHADS2VASC=5, no anticoag 2nd fall risk  Signed, Leanna BattlesBarrett, Rhonda , PA-C 7:46 AM 08/11/2015  Pulmonary edema much improved.  Rate control is fine No anticoagulation due to age , frailty and fall risk Ok to go to SNF today  Charlton HawsPeter Smriti Barkow

## 2015-08-11 NOTE — Progress Notes (Signed)
[facility]- family and patient agreeable to this plan- plan transfer via EMS. Reece LevyJanet Curlee Bogan, MSW, Theresia MajorsLCSWA [phone]

## 2015-08-11 NOTE — Progress Notes (Signed)
Utilization review completed.  

## 2015-08-11 NOTE — Discharge Summary (Addendum)
Alejandra Bailey, is a 81 y.o. female  DOB 22-Jul-1923  MRN [id number].  Admission date:  08/09/2015  Admitting Physician  Leroy Sea, MD  Discharge Date:  08/11/2015   Primary MD  Darrow Bussing, MD  Recommendations for primary care physician for things to follow:   Monitor weight, BMP and diuretic dose closely. 2 view CXR in 1 week.     Admission Diagnosis  Atrial fibrillation with rapid ventricular response (HCC) [I48.91] Congestive heart failure, unspecified congestive heart failure chronicity, unspecified congestive heart failure type (HCC) [I50.9]   Discharge Diagnosis  Atrial fibrillation with rapid ventricular response (HCC) [I48.91] Congestive heart failure, unspecified congestive heart failure chronicity, unspecified congestive heart failure type (HCC) [I50.9]     Principal Problem:   Acute on chronic diastolic congestive heart failure due to valvular disease (HCC) Active Problems:   Essential hypertension, benign   Vitamin D deficiency   Cellulitis   Chronic a-fib (HCC) - CHADS2VASC=5, no anticoag 2nd fall risk   Atrial fibrillation with rapid ventricular response [facility])      Past Medical History  Diagnosis Date  . Hypertension   . Tremor   . High cholesterol   . History of pelvic fracture 2011    "no OR"  . History of measles   . History of mumps   . Permanent atrial fibrillation (HCC)     CHADS2VASC=5, no anticoag 2nd fall risk  . Parkinson disease (HCC)     pt denies this hx on 05/05/2015  . Family history of adverse reaction to anesthesia   . Arthritis     "thumbs" (05/05/2015)  . Chronic back pain greater than 3 months duration     Past Surgical History  Procedure Laterality Date  . Tonsillectomy  1934  . Cataract extraction w/ intraocular lens  implant, bilateral Bilateral         HPI  from the history and physical done on the day of admission:   Alejandra Bailey is a 81 y.o. female, history of chronic atrial fibrillation, Parkinson's disease, essential hypertension, mild dementia, history of pelvic fracture, who lives in assisted living facility and was in the process of transferring to a SNF is brought in by family members after she started complaining of some exertional shortness of breath and orthopnea for the last day. Does have a dry cough, no fever, no exposure to sick contacts, no chest pain. Denies any abdominal pain or diarrhea. Recently been diagnosed with bilateral lower extremity cellulitis.  In the ER workup suggestive of acute on chronic diastolic dysfunction, acute hypoxic respiratory failure with requirement of 2 L nasal cannula oxygen, she was 87% on room air, respiration rate 23/minute, atrial fibrillation with RVR, I was called to admit the patient.     Hospital Course:   1. Acute on chronic hypoxic respiratory failure due to acute on chronic diastolic CHF. EF 50% on recent echogram done a few months ago. She remained stable on telemetry bed, improved considerably after IV Lasix,  salt and fluid restriction and  Nitropaste. Continue low-dose beta blocker and ACE inhibitor. Will now taper to oral Lasix along with Imdur, now close to compensated with rales completely resolved and only trace edema, may continue oxygen 1-2 L for comfort, monitor weight BMP and diuretic dose closely. Continue salt and 1.5 L per day fluid restriction. Get a 2 view CXR in 1 week.  In chair in day time, air mattress as needed.   2. Minimal bilateral lower extremity cellulitis. Placed on doxycycline and continue for another 5 days then stop. Clinically almost completely resolved.   3. Chronic atrial fibrillation with RVR in the ER, [place] Vasc 2 score of 5. Not anti-evaluation candidate due to high fall risk, currently on aspirin and beta blocker will be continued, old  should be rate controlled. One-time outpatient cardiology follow-up in the next 1-2 weeks.   4. Mild dementia. At risk for delirium. Minimize narcotics and benzodiazepine. Feeding assistance and aspiration precautions as needed.   5. Essential hypertension. Placed on beta blocker, low-dose ACE inhibitor along with nitro paste. Continue to monitor.       Discharge Condition: Fair  Follow UP  Follow-up Information    Follow up with Darrow Bussing, MD. Schedule an appointment as soon as possible for a visit in 1 week.   Specialty:  Family Medicine   Contact information:   [address] [place] [address] [phone]       Follow up with Charlton Haws, MD. Schedule an appointment as soon as possible for a visit in 1 week.   Specialty:  Cardiology   Why:  CHF   Contact information:   [address] [place] [address] [phone]        Consults obtained - Cards  Diet and Activity recommendation: See Discharge Instructions below  Discharge Instructions       Discharge Instructions    Discharge instructions    Complete by:  As directed   Follow with Primary MD Darrow Bussing, MD in 7 days   Get CBC, CMP, 2 view Chest X ray checked  by Primary MD next visit.    Activity: As tolerated with Full fall precautions use walker/cane & assistance as needed   Disposition SNF   Diet:   Heart Healthy. Feeding assistance and aspiration precautions. Check your Weight same time everyday, if you gain over 2 pounds, or you develop in leg swelling, experience more shortness of breath or chest pain, call your Primary MD immediately. Follow Cardiac Low Salt Diet and 1.5 lit/day fluid restriction.   On your next visit with your primary care physician please Get Medicines reviewed and adjusted.   Please request your Prim.MD to go over all Hospital Tests and Procedure/Radiological results at the follow up, please get all Hospital records sent to  your Prim MD by signing hospital release before you go home.   If you experience worsening of your admission symptoms, develop shortness of breath, life threatening emergency, suicidal or homicidal thoughts you must seek medical attention immediately by calling 911 or calling your MD immediately  if symptoms less severe.  You Must read complete instructions/literature along with all the possible adverse reactions/side effects for all the Medicines you take and that have been prescribed to you. Take any new Medicines after you have completely understood and accpet all the possible adverse reactions/side effects.   Do not drive, operating heavy machinery, perform activities at heights, swimming or participation in water activities or provide baby sitting services if your were  admitted for syncope or siezures until you have seen by Primary MD or a Neurologist and advised to do so again.  Do not drive when taking Pain medications.    Do not take more than prescribed Pain, Sleep and Anxiety Medications  Special Instructions: If you have smoked or chewed Tobacco  in the last 2 yrs please stop smoking, stop any regular Alcohol  and or any Recreational drug use.  Wear Seat belts while driving.   Please note  You were cared for by a hospitalist during your hospital stay. If you have any questions about your discharge medications or the care you received while you were in the hospital after you are discharged, you can call the unit and asked to speak with the hospitalist on call if the hospitalist that took care of you is not available. Once you are discharged, your primary care physician will handle any further medical issues. Please note that NO REFILLS for any discharge medications will be authorized once you are discharged, as it is imperative that you return to your primary care physician (or establish a relationship with a primary care physician if you do not have one) for your aftercare needs so  that they can reassess your need for medications and monitor your lab values.     Increase activity slowly    Complete by:  As directed              Discharge Medications       Medication List    STOP taking these medications        benazepril-hydrochlorthiazide 20-25 MG tablet  Commonly known as:  LOTENSIN HCT     cephALEXin 500 MG capsule  Commonly known as:  KEFLEX     traMADol 50 MG tablet  Commonly known as:  ULTRAM      TAKE these medications        aspirin EC 325 MG tablet  Take 1 tablet (325 mg total) by mouth daily.     donepezil 5 MG tablet  Commonly known as:  ARICEPT  Take 5 mg by mouth daily.     doxycycline 100 MG tablet  Commonly known as:  VIBRA-TABS  Take 1 tablet (100 mg total) by mouth every 12 (twelve) hours. gor 5 more days     FISH OIL PO  Take 1,000 mg by mouth daily.     furosemide 20 MG tablet  Commonly known as:  LASIX  Take 2 tablets (40 mg total) by mouth 2 (two) times daily.     isosorbide mononitrate 30 MG 24 hr tablet  Commonly known as:  IMDUR  Take 1 tablet (30 mg total) by mouth daily.     lisinopril 2.5 MG tablet  Commonly known as:  PRINIVIL,ZESTRIL  Take 1 tablet (2.5 mg total) by mouth daily.     metoprolol tartrate 25 MG tablet  Commonly known as:  LOPRESSOR  Take 1 tablet (25 mg total) by mouth 2 (two) times daily.     POTASSIUM PO  Take 1 tablet by mouth daily.     primidone 50 MG tablet  Commonly known as:  MYSOLINE  Take two tablets by mouth once daily     saccharomyces boulardii 250 MG capsule  Commonly known as:  FLORASTOR  Take 1 capsule (250 mg total) by mouth 2 (two) times daily.     VITAMIN D-3 PO  Take 1,000 Units by mouth daily.        Major procedures and  Radiology Reports - PLEASE review detailed and final reports for all details, in brief -       Dg Chest 2 View  08/04/2015  CLINICAL DATA:  One week history of productive cough and congestion. EXAM: CHEST  2 VIEW COMPARISON:   May 05, 2015 FINDINGS: There is a moderate right pleural effusion with atelectasis and possibly consolidation in the right middle and lower lobes. Left lung is clear. Heart is upper normal in size with pulmonary vascularity within normal limits. Aorta is tortuous with atherosclerotic calcification. There is no demonstrable adenopathy. Bones diffusely osteoporotic with multiple compression fractures throughout the thoracic spine, a stable finding. There is thoracic and lumbar levoscoliosis. IMPRESSION: Moderate-sized right pleural effusion with right middle and lower lobe atelectasis and possibly consolidation. Left lung clear. Heart size upper normal. Multiple compression fractures appear stable. Bones diffusely osteoporotic. Electronically Signed   By: Bretta Bang III M.D.   On: 08/04/2015 13:39   Dg Chest Port 1 View  08/09/2015  CLINICAL DATA:  81 year old female with increased confusion and shortness of breath. Atrial fibrillation and tachycardia. Initial encounter. EXAM: PORTABLE CHEST 1 VIEW COMPARISON:  08/04/2015 and earlier. FINDINGS: Portable AP semi upright view at 1117 hours. Continued bilateral pleural effusions greater on the right. That on the left may have mildly increased. Pulmonary vascularity has increased compatible with interstitial edema. Stable cardiomegaly and mediastinal contours. Tortuous calcified thoracic aorta. Increased right perihilar atelectasis. No pneumothorax. Osteopenia. Multilevel thoracic compression fractures appear grossly stable. IMPRESSION: 1. Acute pulmonary interstitial edema. Increased right lung atelectasis. 2. Continued bilateral pleural effusions greater on the right. 3. Cardiomegaly and calcified, tortuous thoracic aorta. Electronically Signed   By: Odessa Fleming M.D.   On: 08/09/2015 11:56    Micro Results      Recent Results (from the past 240 hour(s))  Culture, blood (routine x 2)     Status: None (Preliminary result)   Collection Time: 08/09/15  11:00 AM  Result Value Ref Range Status   Specimen Description BLOOD RIGHT ANTECUBITAL  Final   Special Requests BOTTLES DRAWN AEROBIC AND ANAEROBIC  Final   Culture   Final    NO GROWTH 1 DAY Performed at [facility]    Report Status PENDING  Incomplete  Culture, blood (routine x 2)     Status: None (Preliminary result)   Collection Time: 08/09/15 11:10 AM  Result Value Ref Range Status   Specimen Description BLOOD RIGHT FOREARM  Final   Special Requests BOTTLES DRAWN AEROBIC AND ANAEROBIC  Final   Culture   Final    NO GROWTH 1 DAY Performed at [facility]    Report Status PENDING  Incomplete  Urine culture     Status: None   Collection Time: 08/09/15  2:28 PM  Result Value Ref Range Status   Specimen Description URINE, CATHETERIZED  Final   Special Requests NONE  Final   Culture   Final    NO GROWTH 1 DAY Performed at [facility]    Report Status 08/10/2015 FINAL  Final  MRSA PCR Screening     Status: None   Collection Time: 08/09/15  6:29 PM  Result Value Ref Range Status   MRSA by PCR NEGATIVE NEGATIVE Final    Comment:        The GeneXpert MRSA Assay (FDA approved for NASAL specimens only), is one component of a comprehensive MRSA colonization surveillance program. It is not intended to diagnose MRSA infection nor to  guide or monitor treatment for MRSA infections.     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   Objective   Blood pressure 131/75, pulse 81, temperature 97.8 F (36.6 C), temperature source Oral, resp. rate 24, height 5' (1.524 m), weight 50.6 kg (111 lb 8.8 oz), SpO2 94 %.   Intake/Output Summary (Last 24 hours) at 08/11/15 0756 Last data filed at 08/10/15 2053  Gross per 24 hour  Intake    840 ml  Output    550 ml  Net    290 ml    Exam Awake Alert, Oriented x 1, No new F.N deficits, Normal  affect .AT,PERRAL Supple Neck,No JVD, No cervical lymphadenopathy appriciated.  Symmetrical Chest wall movement, Good air movement bilaterally, CTAB RRR,No Gallops,Rubs or new Murmurs, No Parasternal Heave +ve B.Sounds, Abd Soft, Non tender, No organomegaly appriciated, No rebound -guarding or rigidity. No Cyanosis, Clubbing , trace edema, No new Rash or bruise   Data Review   CBC w Diff: Lab Results  Component Value Date   WBC 9.9 08/09/2015   WBC 5.4 03/07/2015   HGB 14.2 08/09/2015   HCT 44.1 08/09/2015   HCT 41.0 03/07/2015   PLT 320 08/09/2015   LYMPHOPCT 14 08/09/2015   MONOPCT 8 08/09/2015   EOSPCT 0 08/09/2015   BASOPCT 0 08/09/2015    CMP: Lab Results  Component Value Date   NA 138 08/11/2015   NA 133* 03/07/2015   K 3.9 08/11/2015   CL 102 08/11/2015   CO2 26 08/11/2015   BUN 22* 08/11/2015   BUN 11 03/07/2015   CREATININE 0.75 08/11/2015   PROT 7.4 08/09/2015   PROT 7.2 03/07/2015   ALBUMIN 3.7 08/09/2015   ALBUMIN 4.3 03/07/2015   BILITOT 1.0 08/09/2015   BILITOT 0.5 03/07/2015   ALKPHOS 108 08/09/2015   AST 26 08/09/2015   ALT 19 08/09/2015    Total Time in preparing paper work, data evaluation and todays exam - 35 minutes  Susa Raring K M.D on 08/11/2015 at 7:56 AM  [facility]   Office  [phone]

## 2015-08-11 NOTE — Discharge Instructions (Signed)
Follow with Primary MD Darrow BussingKOIRALA,DIBAS, MD in 7 days   Get CBC, CMP, 2 view Chest X ray checked  by Primary MD next visit.    Activity: As tolerated with Full fall precautions use walker/cane & assistance as needed   Disposition SNF   Diet:   Heart Healthy. Feeding assistance and aspiration precautions. Check your Weight same time everyday, if you gain over 2 pounds, or you develop in leg swelling, experience more shortness of breath or chest pain, call your Primary MD immediately. Follow Cardiac Low Salt Diet and 1.5 lit/day fluid restriction.   On your next visit with your primary care physician please Get Medicines reviewed and adjusted.   Please request your Prim.MD to go over all Hospital Tests and Procedure/Radiological results at the follow up, please get all Hospital records sent to your Prim MD by signing hospital release before you go home.   If you experience worsening of your admission symptoms, develop shortness of breath, life threatening emergency, suicidal or homicidal thoughts you must seek medical attention immediately by calling 911 or calling your MD immediately  if symptoms less severe.  You Must read complete instructions/literature along with all the possible adverse reactions/side effects for all the Medicines you take and that have been prescribed to you. Take any new Medicines after you have completely understood and accpet all the possible adverse reactions/side effects.   Do not drive, operating heavy machinery, perform activities at heights, swimming or participation in water activities or provide baby sitting services if your were admitted for syncope or siezures until you have seen by Primary MD or a Neurologist and advised to do so again.  Do not drive when taking Pain medications.    Do not take more than prescribed Pain, Sleep and Anxiety Medications  Special Instructions: If you have smoked or chewed Tobacco  in the last 2 yrs please stop smoking, stop  any regular Alcohol  and or any Recreational drug use.  Wear Seat belts while driving.   Please note  You were cared for by a hospitalist during your hospital stay. If you have any questions about your discharge medications or the care you received while you were in the hospital after you are discharged, you can call the unit and asked to speak with the hospitalist on call if the hospitalist that took care of you is not available. Once you are discharged, your primary care physician will handle any further medical issues. Please note that NO REFILLS for any discharge medications will be authorized once you are discharged, as it is imperative that you return to your primary care physician (or establish a relationship with a primary care physician if you do not have one) for your aftercare needs so that they can reassess your need for medications and monitor your lab values.

## 2015-08-14 LAB — CULTURE, BLOOD (ROUTINE X 2)
Culture: NO GROWTH
Culture: NO GROWTH

## 2015-08-15 ENCOUNTER — Emergency Department (HOSPITAL_COMMUNITY): Payer: Commercial Managed Care - HMO

## 2015-08-15 ENCOUNTER — Emergency Department (HOSPITAL_COMMUNITY)
Admission: EM | Admit: 2015-08-15 | Discharge: 2015-08-15 | Disposition: A | Discharge status: Home / Self Care | Payer: Commercial Managed Care - HMO | Attending: Emergency Medicine | Admitting: Emergency Medicine

## 2015-08-15 ENCOUNTER — Encounter (HOSPITAL_COMMUNITY): Payer: Self-pay | Admitting: Nurse Practitioner

## 2015-08-15 DIAGNOSIS — I959 Hypotension, unspecified: Secondary | ICD-10-CM | POA: Diagnosis present

## 2015-08-15 DIAGNOSIS — I10 Essential (primary) hypertension: Secondary | ICD-10-CM | POA: Diagnosis not present

## 2015-08-15 DIAGNOSIS — G20 Parkinson's disease: Secondary | ICD-10-CM | POA: Diagnosis not present

## 2015-08-15 DIAGNOSIS — Z79899 Other long term (current) drug therapy: Secondary | ICD-10-CM | POA: Insufficient documentation

## 2015-08-15 DIAGNOSIS — G8929 Other chronic pain: Secondary | ICD-10-CM | POA: Diagnosis not present

## 2015-08-15 DIAGNOSIS — E860 Dehydration: Secondary | ICD-10-CM | POA: Insufficient documentation

## 2015-08-15 DIAGNOSIS — Z7982 Long term (current) use of aspirin: Secondary | ICD-10-CM | POA: Insufficient documentation

## 2015-08-15 DIAGNOSIS — M1990 Unspecified osteoarthritis, unspecified site: Secondary | ICD-10-CM | POA: Diagnosis not present

## 2015-08-15 HISTORY — DX: Unspecified atrial fibrillation: I48.91

## 2015-08-15 LAB — CBC WITH DIFFERENTIAL/PLATELET
Basophils Absolute: 0 10*3/uL (ref 0.0–0.1)
Basophils Relative: 0 %
EOS PCT: 0 %
Eosinophils Absolute: 0 10*3/uL (ref 0.0–0.7)
HCT: 38.4 % (ref 36.0–46.0)
HEMOGLOBIN: 12.4 g/dL (ref 12.0–15.0)
Lymphocytes Relative: 13 %
Lymphs Abs: 1.1 10*3/uL (ref 0.7–4.0)
MCH: 30.2 pg (ref 26.0–34.0)
MCHC: 32.3 g/dL (ref 30.0–36.0)
MCV: 93.7 fL (ref 78.0–100.0)
Monocytes Absolute: 0.7 10*3/uL (ref 0.1–1.0)
Monocytes Relative: 9 %
NEUTROS ABS: 6.7 10*3/uL (ref 1.7–7.7)
NEUTROS PCT: 78 %
PLATELETS: 307 10*3/uL (ref 150–400)
RBC: 4.1 MIL/uL (ref 3.87–5.11)
RDW: 14.4 % (ref 11.5–15.5)
WBC: 8.6 10*3/uL (ref 4.0–10.5)

## 2015-08-15 LAB — URINALYSIS, ROUTINE W REFLEX MICROSCOPIC
Bilirubin Urine: NEGATIVE
GLUCOSE, UA: NEGATIVE mg/dL
HGB URINE DIPSTICK: NEGATIVE
Ketones, ur: NEGATIVE mg/dL
Leukocytes, UA: NEGATIVE
Nitrite: NEGATIVE
Protein, ur: NEGATIVE mg/dL
SPECIFIC GRAVITY, URINE: 1.01 (ref 1.005–1.030)
pH: 6.5 (ref 5.0–8.0)

## 2015-08-15 LAB — I-STAT CHEM 8, ED
BUN: 30 mg/dL — ABNORMAL HIGH (ref 6–20)
CHLORIDE: 99 mmol/L — AB (ref 101–111)
Calcium, Ion: 1.05 mmol/L — ABNORMAL LOW (ref 1.13–1.30)
Creatinine, Ser: 0.8 mg/dL (ref 0.44–1.00)
GLUCOSE: 113 mg/dL — AB (ref 65–99)
HEMATOCRIT: 43 % (ref 36.0–46.0)
HEMOGLOBIN: 14.6 g/dL (ref 12.0–15.0)
POTASSIUM: 4.4 mmol/L (ref 3.5–5.1)
SODIUM: 134 mmol/L — AB (ref 135–145)
TCO2: 27 mmol/L (ref 0–100)

## 2015-08-15 LAB — I-STAT TROPONIN, ED: TROPONIN I, POC: 0 ng/mL (ref 0.00–0.08)

## 2015-08-15 MED ORDER — SODIUM CHLORIDE 0.9 % IV BOLUS (SEPSIS)
500.0000 mL | Freq: Once | INTRAVENOUS | Status: AC
  Administered 2015-08-15: 500 mL via INTRAVENOUS

## 2015-08-15 NOTE — ED Notes (Signed)
PT continues to rest quietly with eyes closed.  No complaints voiced

## 2015-08-15 NOTE — ED Notes (Signed)
Pt sent from SNF per daughters request for fatigue, possibly taking too much metoprolol. Pt denies any complaints or pain, alert, breathing easily

## 2015-08-15 NOTE — ED Notes (Signed)
Alejandra Bailey [phone]

## 2015-08-15 NOTE — ED Notes (Signed)
PTAR CALLED  °

## 2015-08-15 NOTE — ED Provider Notes (Signed)
CSN: [id number]     Arrival date & time 08/15/15  1221 History   First MD Initiated Contact with Patient 08/15/15 1222     Chief Complaint  Patient presents with  . Hypotension      The history is provided by the patient and a relative.   patient presents with a syncopal episode. Reportedly found to have low blood pressure after syncope. She did rehabilitation this morning and then sat down in her room and reportedly passed out. Has recently been inpatient for CHF exacerbation A. fib with RVR. There is some change the medications. Patient's daughter is unsure if the nursing home is giving the patient the medication properly. Patient states she felt fine. She ate breakfast this morning. She is not sure what happened. No headache. No confusion. No dysuria. She does have a small appetite start with.  Past Medical History  Diagnosis Date  . Hypertension   . Tremor   . High cholesterol   . History of pelvic fracture 2011    "no OR"  . History of measles   . History of mumps   . Permanent atrial fibrillation (HCC)     CHADS2VASC=5, no anticoag 2nd fall risk  . Parkinson disease (HCC)     pt denies this hx on 05/05/2015  . Family history of adverse reaction to anesthesia   . Arthritis     "thumbs" (05/05/2015)  . Chronic back pain greater than 3 months duration   . Atrial fibrillation [facility])    Past Surgical History  Procedure Laterality Date  . Tonsillectomy  1934  . Cataract extraction w/ intraocular lens  implant, bilateral Bilateral    Family History  Problem Relation Age of Onset  . Stroke Father   . Breast cancer Mother   . Gout Son   . Heart Problems Son     Triple Bypass  . Cancer Maternal Grandmother 62    breast cancer  . Cancer Maternal Grandfather 50    stomach cancer   Social History  Substance Use Topics  . Smoking status: Never Smoker   . Smokeless tobacco: Never Used  . Alcohol Use: No   OB History    No data available     Review of Systems   Constitutional: Negative for activity change and appetite change.  Respiratory: Negative for chest tightness and shortness of breath.   Cardiovascular: Negative for chest pain.  Musculoskeletal: Negative for back pain.  Skin: Negative for color change.  Neurological: Positive for syncope. Negative for light-headedness.      Allergies  Review of patient's allergies indicates no known allergies.  Home Medications   Prior to Admission medications   Medication Sig Start Date End Date Taking? Authorizing Provider  aspirin EC 325 MG tablet Take 1 tablet (325 mg total) by mouth daily. 11/03/13  Yes Mahima Glade Lloyd, MD  Cholecalciferol (VITAMIN D-3 PO) Take 1,000 Units by mouth daily.    Yes Historical Provider, MD  donepezil (ARICEPT) 5 MG tablet Take 5 mg by mouth daily. 07/23/15  Yes Historical Provider, MD  doxycycline (VIBRA-TABS) 100 MG tablet Take 1 tablet (100 mg total) by mouth every 12 (twelve) hours. gor 5 more days 08/11/15  Yes Leroy Sea, MD  furosemide (LASIX) 20 MG tablet Take 2 tablets (40 mg total) by mouth 2 (two) times daily. 08/11/15  Yes Leroy Sea, MD  isosorbide mononitrate (IMDUR) 30 MG 24 hr tablet Take 1 tablet (30 mg total) by mouth daily. 08/11/15  Yes  Leroy Sea, MD  lisinopril (PRINIVIL,ZESTRIL) 2.5 MG tablet Take 1 tablet (2.5 mg total) by mouth daily. 08/11/15  Yes Leroy Sea, MD  metoprolol tartrate (LOPRESSOR) 25 MG tablet Take 1 tablet (25 mg total) by mouth 2 (two) times daily. 08/11/15  Yes Leroy Sea, MD  Omega-3 Fatty Acids (FISH OIL PO) Take 1,000 mg by mouth daily.    Yes Historical Provider, MD  potassium chloride SA (K-DUR,KLOR-CON) 20 MEQ tablet Take 40 mEq by mouth daily.   Yes Historical Provider, MD  primidone (MYSOLINE) 50 MG tablet Take two tablets by mouth once daily Patient taking differently: Take 100 mg by mouth daily.  01/19/15  Yes Sharon Seller, NP  saccharomyces boulardii (FLORASTOR) 250 MG capsule Take 1  capsule (250 mg total) by mouth 2 (two) times daily. 05/07/15  Yes Maryann Mikhail, DO  UNABLE TO FIND Take 90 mLs by mouth 2 (two) times daily. Med Name: MED PASS   Yes Historical Provider, MD   BP 92/66 mmHg  Pulse 77  Temp(Src) 97.5 F (36.4 C) (Axillary)  Resp 22  Wt 97 lb (43.999 kg)  SpO2 98% Physical Exam  Constitutional: She is oriented to person, place, and time. She appears well-developed.  Somewhat cachectic  Cardiovascular: Normal rate.   Pulmonary/Chest: Effort normal.  Abdominal: Soft.  Neurological: She is alert and oriented to person, place, and time.  Skin: Skin is warm.    ED Course  Procedures (including critical care time) Labs Review Labs Reviewed  I-STAT CHEM 8, ED - Abnormal; Notable for the following:    Sodium 134 (*)    Chloride 99 (*)    BUN 30 (*)    Glucose, Bld 113 (*)    Calcium, Ion 1.05 (*)    All other components within normal limits  CBC WITH DIFFERENTIAL/PLATELET  URINALYSIS, ROUTINE W REFLEX MICROSCOPIC (NOT AT [facility])  Rosezena Sensor, ED    Imaging Review Dg Chest Portable 1 View  08/15/2015  CLINICAL DATA:  Syncope EXAM: PORTABLE CHEST 1 VIEW COMPARISON:  08/09/2015 FINDINGS: Cardiomegaly again noted. Central vascular congestion without convincing pulmonary edema. Stable compression deformities thoracic spine. Persistent right lower lobe atelectasis or infiltrate. Small to moderate right pleural effusion. Small left pleural effusion. Left basilar atelectasis. Atherosclerotic calcifications of thoracic aorta. Mild thoracic scoliosis stable. IMPRESSION: Central vascular congestion without convincing pulmonary edema. Stable compression deformities thoracic spine. Persistent right lower lobe atelectasis or infiltrate. Small to moderate right pleural effusion. Small left pleural effusion. Left basilar atelectasis. Electronically Signed   By: Natasha Mead M.D.   On: 08/15/2015 13:19   I have personally reviewed and evaluated these images and lab  results as part of my medical decision-making.   EKG Interpretation   Date/Time:  Tuesday August 15 2015 13:19:49 EST Ventricular Rate:  97 PR Interval:    QRS Duration: 97 QT Interval:  412 QTC Calculation: 523 R Axis:   -13 Text Interpretation:  Atrial fibrillation Ventricular premature complex  RSR' in V1 or V2, probably normal variant Confirmed by Rubin Payor  MD,  Harrold Donath [phone]) on 08/15/2015 1:30:57 PM      MDM   Final diagnoses:  Dehydration    Patient with syncopal episode. Blood pressure low in likely due to dehydration//over diuresis. Weight has gone down from 111 pounds to 97 pounds. Patient states she feels okay. No fevers or chills. Lab work is overall reassuring chest x-ray shows congestion without edema. Blood pressure improves and patient feels better he can  likely be discharged. Otherwise may require admission to internal medicine. Care turned over to Dr Verdie MosherLiu.       Benjiman CoreNathan Anali Cabanilla, MD 08/15/15 [phone]

## 2015-08-15 NOTE — ED Provider Notes (Signed)
Please refer to previous provider's note regarding patient's initial presenting history and physical, ED course, and associated MDM. In short this is a 81 year old female who presents with low blood pressures and syncope in the setting of dehydration and over diuresis during recent hospitalization for heart failure. She has had unremarkable workup aside from minimal AK I, and had received 500 mL bolus of fluids. Plan was to reassess after fluids and if stable is appropriate for discharge home. She is observed here in the emergency department and after IV fluids has had blood pressures of 122/70, which is her baseline blood pressures. She has no complaints at this time, and is felt appropriate for discharge home. I discussed strict return instructions as well as close follow-up instructions with her primary care doctor to discuss any further medication changes as needed.  Lavera Guiseana Duo Tyvon Eggenberger, MD 08/15/15 [phone]

## 2015-08-15 NOTE — ED Notes (Signed)
Report called to Antoinette at [facility]. 

## 2015-08-15 NOTE — Discharge Instructions (Signed)
Please call your primary care doctor tomorrow to discuss any necessary changes to your blood pressure medications. Return for worsening symptoms,including passing out, chest pain, difficulty breathing, or any other symptoms concerning to you.  Dehydration, Adult Dehydration is a condition in which you do not have enough fluid or water in your body. It happens when you take in less fluid than you lose. Vital organs such as the kidneys, brain, and heart cannot function without a proper amount of fluids. Any loss of fluids from the body can cause dehydration.  Dehydration can range from mild to severe. This condition should be treated right away to help prevent it from becoming severe. CAUSES  This condition may be caused by:  Vomiting.  Diarrhea.  Excessive sweating, such as when exercising in hot or humid weather.  Not drinking enough fluid during strenuous exercise or during an illness.  Excessive urine output.  Fever.  Certain medicines. RISK FACTORS This condition is more likely to develop in:  People who are taking certain medicines that cause the body to lose excess fluid (diuretics).   People who have a chronic illness, such as diabetes, that may increase urination.  Older adults.   People who live at high altitudes.   People who participate in endurance sports.  SYMPTOMS  Mild Dehydration  Thirst.  Dry lips.  Slightly dry mouth.  Dry, warm skin. Moderate Dehydration  Very dry mouth.   Muscle cramps.   Dark urine and decreased urine production.   Decreased tear production.   Headache.   Light-headedness, especially when you stand up from a sitting position.  Severe Dehydration  Changes in skin.   Cold and clammy skin.   Skin does not spring back quickly when lightly pinched and released.   Changes in body fluids.   Extreme thirst.   No tears.   Not able to sweat when body temperature is high, such as in hot weather.    Minimal urine production.   Changes in vital signs.   Rapid, weak pulse (more than 100 beats per minute when you are sitting still).   Rapid breathing.   Low blood pressure.   Other changes.   Sunken eyes.   Cold hands and feet.   Confusion.  Lethargy and difficulty being awakened.  Fainting (syncope).   Short-term weight loss.   Unconsciousness. DIAGNOSIS  This condition may be diagnosed based on your symptoms. You may also have tests to determine how severe your dehydration is. These tests may include:   Urine tests.   Blood tests.  TREATMENT  Treatment for this condition depends on the severity. Mild or moderate dehydration can often be treated at home. Treatment should be started right away. Do not wait until dehydration becomes severe. Severe dehydration needs to be treated at the hospital. Treatment for Mild Dehydration  Drinking plenty of water to replace the fluid you have lost.   Replacing minerals in your blood (electrolytes) that you may have lost.  Treatment for Moderate Dehydration  Consuming oral rehydration solution (ORS). Treatment for Severe Dehydration  Receiving fluid through an IV tube.   Receiving electrolyte solution through a feeding tube that is passed through your nose and into your stomach (nasogastric tube or NG tube).  Correcting any abnormalities in electrolytes. HOME CARE INSTRUCTIONS   Drink enough fluid to keep your urine clear or pale yellow.   Drink water or fluid slowly by taking small sips. You can also try sucking on ice cubes.  Have food or  beverages that contain electrolytes. Examples include bananas and sports drinks.  Take over-the-counter and prescription medicines only as told by your health care provider.   Prepare ORS according to the manufacturer's instructions. Take sips of ORS every 5 minutes until your urine returns to normal.  If you have vomiting or diarrhea, continue to try to drink  water, ORS, or both.   If you have diarrhea, avoid:   Beverages that contain caffeine.   Fruit juice.   Milk.   Carbonated soft drinks.  Do not take salt tablets. This can lead to the condition of having too much sodium in your body (hypernatremia).  SEEK MEDICAL CARE IF:  You cannot eat or drink without vomiting.  You have had moderate diarrhea during a period of more than 24 hours.  You have a fever. SEEK IMMEDIATE MEDICAL CARE IF:   You have extreme thirst.  You have severe diarrhea.  You have not urinated in 6-8 hours, or you have urinated only a small amount of very dark urine.  You have shriveled skin.  You are dizzy, confused, or both.   This information is not intended to replace advice given to you by your health care provider. Make sure you discuss any questions you have with your health care provider.   Document Released: 08/12/2005 Document Revised: 05/03/2015 Document Reviewed: 12/28/2014 Elsevier Interactive Patient Education [employer]2016 Elsevier Inc.

## 2015-08-22 ENCOUNTER — Ambulatory Visit (INDEPENDENT_AMBULATORY_CARE_PROVIDER_SITE_OTHER): Payer: Commercial Managed Care - HMO | Admitting: Cardiology

## 2015-08-22 ENCOUNTER — Encounter: Payer: Self-pay | Admitting: Cardiology

## 2015-08-22 VITALS — BP 90/52 | HR 72 | Ht 60.0 in | Wt 86.5 lb

## 2015-08-22 DIAGNOSIS — F0390 Unspecified dementia without behavioral disturbance: Secondary | ICD-10-CM

## 2015-08-22 DIAGNOSIS — G20 Parkinson's disease: Secondary | ICD-10-CM

## 2015-08-22 DIAGNOSIS — I482 Chronic atrial fibrillation, unspecified: Secondary | ICD-10-CM

## 2015-08-22 DIAGNOSIS — I5033 Acute on chronic diastolic (congestive) heart failure: Secondary | ICD-10-CM

## 2015-08-22 DIAGNOSIS — G20A1 Parkinson's disease without dyskinesia, without mention of fluctuations: Secondary | ICD-10-CM | POA: Insufficient documentation

## 2015-08-22 DIAGNOSIS — I10 Essential (primary) hypertension: Secondary | ICD-10-CM | POA: Diagnosis not present

## 2015-08-22 DIAGNOSIS — Z79899 Other long term (current) drug therapy: Secondary | ICD-10-CM

## 2015-08-22 DIAGNOSIS — I4891 Unspecified atrial fibrillation: Secondary | ICD-10-CM

## 2015-08-22 MED ORDER — FUROSEMIDE 20 MG PO TABS: 40.0000 mg | ORAL_TABLET | Freq: Every day | ORAL | Status: AC

## 2015-08-22 NOTE — Assessment & Plan Note (Signed)
81 y/o DNR SNF pt- moving to assisted living when able

## 2015-08-22 NOTE — Assessment & Plan Note (Signed)
Normal LVF by echo Oct 2016  severe LAE, PA 38 mmHg B/P low today- 90 systolic

## 2015-08-22 NOTE — Assessment & Plan Note (Signed)
Aug 11 2015, secondary to AF with RVR She seems compensated today, possibly a little dry

## 2015-08-22 NOTE — Patient Instructions (Addendum)
Medication Instructions:  DECREASE Furosemide (Lasix to 40 mg ONCE daily  >>If your weight increases over 4 pounds you may take 40 mg TWICE daily STOP Lisinopril  Labwork: Your physician recommends that you return for lab work in at your earliest convenience.  Testing/Procedures: NONE  Follow-Up: Corine ShelterLuke Kilroy, PA-C, recommends that you schedule a follow-up appointment in 2 months with Dr Charlton HawsPeter Nishan.  If you need a refill on your cardiac medications before your next appointment, please call your pharmacy.

## 2015-08-22 NOTE — Assessment & Plan Note (Signed)
-   CHADS2VASC=5, no anticoag 2nd fall risk

## 2015-08-22 NOTE — Progress Notes (Signed)
08/22/2015 Alejandra Bailey   03/28/1923  [id number]  Primary Physician Darrow Bussing, MD Primary Cardiologist: Dr Eden Emms (new)  HPI:  81 y.o. with history of dementia, parkinson's HTN. Has chronic afib not on anticoagulation due to age and frailty. She was just moved to SNF and is a DNR. She presented to [facility] ED 08/09/15 with acute systolic CHF and AF with RVR.  She was admitted and diuresed, adm wgt 112- DC wgt 111. Echo showed normal LVF. She then presented to the ED 08/15/15 with syncope and low B/P. Apparently no changes were made to her medications. She is in the office today for follow up. She presents in a wheel chair on O2. History is given by her family. She has done "OK" at the nursing home.    Current Outpatient Prescriptions  Medication Sig Dispense Refill  . aspirin EC 325 MG tablet Take 1 tablet (325 mg total) by mouth daily. 30 tablet 0  . Cholecalciferol (VITAMIN D-3 PO) Take 1,000 Units by mouth daily.     Marland Kitchen donepezil (ARICEPT) 5 MG tablet Take 5 mg by mouth daily.  0  . doxycycline (VIBRA-TABS) 100 MG tablet Take 1 tablet (100 mg total) by mouth every 12 (twelve) hours. gor 5 more days    . furosemide (LASIX) 20 MG tablet Take 2 tablets (40 mg total) by mouth daily. 60 tablet 11  . isosorbide mononitrate (IMDUR) 30 MG 24 hr tablet Take 1 tablet (30 mg total) by mouth daily.    . metoprolol tartrate (LOPRESSOR) 25 MG tablet Take 1 tablet (25 mg total) by mouth 2 (two) times daily. 30 tablet 0  . Omega-3 Fatty Acids (FISH OIL PO) Take 1,000 mg by mouth daily.     . potassium chloride SA (K-DUR,KLOR-CON) 20 MEQ tablet Take 40 mEq by mouth daily.    . primidone (MYSOLINE) 50 MG tablet Take two tablets by mouth once daily (Patient taking differently: Take 100 mg by mouth daily. ) 60 tablet 1  . saccharomyces boulardii (FLORASTOR) 250 MG capsule Take 1 capsule (250 mg total) by mouth 2 (two) times daily. 60 capsule 0  . UNABLE TO FIND Take 90 mLs by mouth 2 (two) times daily.  Med Name: MED PASS     No current facility-administered medications for this visit.    No Known Allergies  Social History   Social History  . Marital Status: Widowed    Spouse Name: N/A  . Number of Children: N/A  . Years of Education: N/A   Occupational History  . Not on file.   Social History Main Topics  . Smoking status: Never Smoker   . Smokeless tobacco: Never Used  . Alcohol Use: No  . Drug Use: No  . Sexual Activity: No   Other Topics Concern  . Not on file   Social History Narrative     Review of Systems: Unobtainable secondary to dementia   Blood pressure 90/52, pulse 72, height 5' (1.524 m), weight 86 lb 8 oz (39.236 kg).  General appearance: alert, cooperative, no distress and chronically ill, on O2, in wheel chair Lungs: clear to auscultation bilaterally and kyphosis Heart: irregularly irregular rhythm Extremities: no edema Neurologic: Grossly normal, awake and alert   ASSESSMENT AND PLAN:   Acute on chronic diastolic (congestive) heart failure [facility]) Aug 11 2015, secondary to AF with RVR She seems compensated today, possibly a little dry  Atrial fibrillation with rapid ventricular response (HCC) [facility] ED 08/10/15 Rate under better control  today-70-100  Chronic a-fib (HCC)  - CHADS2VASC=5, no anticoag 2nd fall risk  Dementia 81 y/o DNR SNF pt- moving to assisted living when able  Essential hypertension, benign Normal LVF by echo Oct 2016  severe LAE, PA 38 mmHg B/P low today- 90 systolic  Parkinson disease (HCC) Significantly debilitated   PLAN  I stopped her Lisinopril 2.5 mg and cut her Lasix back to 40 mg daily. She will have a BMP drawn. I instructed the nursing home to increase her Lasix to 40 mg BID if her wgt goes up more than 4 lbs in a week. F/U with Dr Eden EmmsNishan in 2 months.   Corine ShelterKILROY,Dedra Matsuo K PA-C 08/22/2015 2:16 PM

## 2015-08-22 NOTE — Assessment & Plan Note (Signed)
[facility] ED 08/10/15 Rate under better control today-70-100

## 2015-08-22 NOTE — Assessment & Plan Note (Signed)
Significantly debilitated 

## 2015-09-08 ENCOUNTER — Encounter (HOSPITAL_COMMUNITY): Payer: Self-pay | Admitting: Emergency Medicine

## 2015-09-08 ENCOUNTER — Emergency Department (HOSPITAL_COMMUNITY)
Admission: EM | Admit: 2015-09-08 | Discharge: 2015-09-09 | Disposition: A | Discharge status: Home / Self Care | Payer: Commercial Managed Care - HMO | Attending: Emergency Medicine | Admitting: Emergency Medicine

## 2015-09-08 ENCOUNTER — Emergency Department (HOSPITAL_COMMUNITY): Payer: Commercial Managed Care - HMO

## 2015-09-08 DIAGNOSIS — I10 Essential (primary) hypertension: Secondary | ICD-10-CM | POA: Diagnosis not present

## 2015-09-08 DIAGNOSIS — G20 Parkinson's disease: Secondary | ICD-10-CM | POA: Insufficient documentation

## 2015-09-08 DIAGNOSIS — J90 Pleural effusion, not elsewhere classified: Secondary | ICD-10-CM | POA: Diagnosis not present

## 2015-09-08 DIAGNOSIS — I5033 Acute on chronic diastolic (congestive) heart failure: Secondary | ICD-10-CM | POA: Insufficient documentation

## 2015-09-08 DIAGNOSIS — Z7982 Long term (current) use of aspirin: Secondary | ICD-10-CM | POA: Diagnosis not present

## 2015-09-08 DIAGNOSIS — Z8781 Personal history of (healed) traumatic fracture: Secondary | ICD-10-CM | POA: Insufficient documentation

## 2015-09-08 DIAGNOSIS — G8929 Other chronic pain: Secondary | ICD-10-CM | POA: Insufficient documentation

## 2015-09-08 DIAGNOSIS — Z79899 Other long term (current) drug therapy: Secondary | ICD-10-CM | POA: Insufficient documentation

## 2015-09-08 DIAGNOSIS — M1990 Unspecified osteoarthritis, unspecified site: Secondary | ICD-10-CM | POA: Diagnosis not present

## 2015-09-08 DIAGNOSIS — Z8639 Personal history of other endocrine, nutritional and metabolic disease: Secondary | ICD-10-CM | POA: Diagnosis not present

## 2015-09-08 DIAGNOSIS — Z7902 Long term (current) use of antithrombotics/antiplatelets: Secondary | ICD-10-CM | POA: Diagnosis not present

## 2015-09-08 DIAGNOSIS — Z8619 Personal history of other infectious and parasitic diseases: Secondary | ICD-10-CM | POA: Insufficient documentation

## 2015-09-08 DIAGNOSIS — R0602 Shortness of breath: Secondary | ICD-10-CM | POA: Diagnosis present

## 2015-09-08 LAB — CBC WITH DIFFERENTIAL/PLATELET
BASOS ABS: 0.1 10*3/uL (ref 0.0–0.1)
BASOS PCT: 1 %
EOS ABS: 0.5 10*3/uL (ref 0.0–0.7)
Eosinophils Relative: 9 %
HCT: 37 % (ref 36.0–46.0)
HEMOGLOBIN: 11.8 g/dL — AB (ref 12.0–15.0)
Lymphocytes Relative: 34 %
Lymphs Abs: 2 10*3/uL (ref 0.7–4.0)
MCH: 29.5 pg (ref 26.0–34.0)
MCHC: 31.9 g/dL (ref 30.0–36.0)
MCV: 92.5 fL (ref 78.0–100.0)
Monocytes Absolute: 0.5 10*3/uL (ref 0.1–1.0)
Monocytes Relative: 8 %
NEUTROS PCT: 48 %
Neutro Abs: 2.8 10*3/uL (ref 1.7–7.7)
Platelets: 274 10*3/uL (ref 150–400)
RBC: 4 MIL/uL (ref 3.87–5.11)
RDW: 14.9 % (ref 11.5–15.5)
WBC: 5.8 10*3/uL (ref 4.0–10.5)

## 2015-09-08 LAB — COMPREHENSIVE METABOLIC PANEL
ALK PHOS: 102 U/L (ref 38–126)
ALT: 19 U/L (ref 14–54)
AST: 18 U/L (ref 15–41)
Albumin: 2.8 g/dL — ABNORMAL LOW (ref 3.5–5.0)
Anion gap: 8 (ref 5–15)
BUN: 15 mg/dL (ref 6–20)
CALCIUM: 9.3 mg/dL (ref 8.9–10.3)
CO2: 26 mmol/L (ref 22–32)
CREATININE: 0.78 mg/dL (ref 0.44–1.00)
Chloride: 103 mmol/L (ref 101–111)
Glucose, Bld: 94 mg/dL (ref 65–99)
Potassium: 4.1 mmol/L (ref 3.5–5.1)
Sodium: 137 mmol/L (ref 135–145)
TOTAL PROTEIN: 5.6 g/dL — AB (ref 6.5–8.1)
Total Bilirubin: 0.4 mg/dL (ref 0.3–1.2)

## 2015-09-08 LAB — I-STAT TROPONIN, ED
TROPONIN I, POC: 0.01 ng/mL (ref 0.00–0.08)
TROPONIN I, POC: 0 ng/mL (ref 0.00–0.08)

## 2015-09-08 LAB — I-STAT CG4 LACTIC ACID, ED
LACTIC ACID, VENOUS: 1.11 mmol/L (ref 0.5–2.0)
Lactic Acid, Venous: 0.93 mmol/L (ref 0.5–2.0)

## 2015-09-08 LAB — BRAIN NATRIURETIC PEPTIDE: B NATRIURETIC PEPTIDE 5: 296.5 pg/mL — AB (ref 0.0–100.0)

## 2015-09-08 MED ORDER — FUROSEMIDE 10 MG/ML IJ SOLN
20.0000 mg | Freq: Once | INTRAMUSCULAR | Status: AC
  Administered 2015-09-08: 20 mg via INTRAVENOUS
  Filled 2015-09-08: qty 2

## 2015-09-08 NOTE — ED Provider Notes (Addendum)
CSN: [id number]     Arrival date & time 09/08/15  1703 History   First MD Initiated Contact with Patient 09/08/15 1712     Chief Complaint  Patient presents with  . Shortness of Breath     (Consider location/radiation/quality/duration/timing/severity/associated sxs/prior Treatment) HPI Comments: 82 year old female with a history of hypertension, hypercholesterolemia, persistent atrial fibrillation not on anticoagulation due to fall risk, Parkinson's disease, diastolic heart failure presents with concern for leg swelling.  Patient reports leg swelling, and facility reports concern for shortness of breath. Patient is chronically on 2 L of oxygen at home, and oxygen saturation is unchanged.  Patient reports leg swelling for days. Facility had x-ray done, which was read as a possible pneumonia. Patient denies any significant cough. Denies fever and chest pain.  Facility reports patient was on a Z-Pak and was sent due to bilat leg swelling, dyspnea, and concerning x-ray.    Past Medical History  Diagnosis Date  . Hypertension   . Tremor   . High cholesterol   . History of pelvic fracture 2011    "no OR"  . History of measles   . History of mumps   . Permanent atrial fibrillation (HCC)     CHADS2VASC=5, no anticoag 2nd fall risk  . Parkinson disease (HCC)     pt denies this hx on 05/05/2015  . Family history of adverse reaction to anesthesia   . Arthritis     "thumbs" (05/05/2015)  . Chronic back pain greater than 3 months duration   . Atrial fibrillation [facility](HCC)    Past Surgical History  Procedure Laterality Date  . Tonsillectomy  1934  . Cataract extraction w/ intraocular lens  implant, bilateral Bilateral    Family History  Problem Relation Age of Onset  . Stroke Father   . Breast cancer Mother   . Gout Son   . Heart Problems Son     Triple Bypass  . Cancer Maternal Grandmother 1670    breast cancer  . Cancer Maternal Grandfather 50    stomach cancer   Social History   Substance Use Topics  . Smoking status: Never Smoker   . Smokeless tobacco: Never Used  . Alcohol Use: No   OB History    No data available     Review of Systems  Constitutional: Negative for fever.  HENT: Negative for sore throat.   Eyes: Negative for visual disturbance.  Respiratory: Positive for cough (occasional) and shortness of breath (mild).   Cardiovascular: Positive for leg swelling. Negative for chest pain.  Gastrointestinal: Negative for nausea, vomiting and abdominal pain.  Genitourinary: Negative for difficulty urinating.  Musculoskeletal: Negative for back pain and neck pain.  Skin: Negative for rash.  Neurological: Negative for syncope and headaches.      Allergies  Review of patient's allergies indicates no known allergies.  Home Medications   Prior to Admission medications   Medication Sig Start Date End Date Taking? Authorizing Provider  aspirin EC 81 MG tablet Take 81 mg by mouth daily.   Yes Historical Provider, MD  Cholecalciferol (VITAMIN D-3 PO) Take 1,000 Units by mouth daily.    Yes Historical Provider, MD  clopidogrel (PLAVIX) 75 MG tablet Take 75 mg by mouth daily.   Yes Historical Provider, MD  donepezil (ARICEPT) 5 MG tablet Take 5 mg by mouth daily. 07/23/15  Yes Historical Provider, MD  furosemide (LASIX) 20 MG tablet Take 2 tablets (40 mg total) by mouth daily. 08/22/15  Yes Abelino DerrickLuke K Kilroy,  PA-C  hydrOXYzine (ATARAX/VISTARIL) 25 MG tablet Take 25 mg by mouth every 6 (six) hours as needed for itching.   Yes Historical Provider, MD  isosorbide mononitrate (IMDUR) 30 MG 24 hr tablet Take 1 tablet (30 mg total) by mouth daily. 08/11/15  Yes Leroy Sea, MD  metoprolol tartrate (LOPRESSOR) 25 MG tablet Take 1 tablet (25 mg total) by mouth 2 (two) times daily. 08/11/15  Yes Leroy Sea, MD  Omega-3 Fatty Acids (FISH OIL PO) Take 1,000 mg by mouth daily.    Yes Historical Provider, MD  potassium chloride SA (K-DUR,KLOR-CON) 20 MEQ tablet  Take 40 mEq by mouth daily.   Yes Historical Provider, MD  primidone (MYSOLINE) 50 MG tablet Take two tablets by mouth once daily Patient taking differently: Take 100 mg by mouth daily.  01/19/15  Yes Sharon Seller, NP  saccharomyces boulardii (FLORASTOR) 250 MG capsule Take 1 capsule (250 mg total) by mouth 2 (two) times daily. 05/07/15  Yes Maryann Mikhail, DO   BP 96/66 mmHg  Pulse 89  Temp(Src) 98.1 F (36.7 C) (Oral)  Resp 20  SpO2 96% Physical Exam  Constitutional: She is oriented to person, place, and time. She appears well-developed and well-nourished. No distress.  HENT:  Head: Normocephalic and atraumatic.  Eyes: Conjunctivae and EOM are normal.  Neck: Normal range of motion. JVD present.  Cardiovascular: Normal rate, regular rhythm, normal heart sounds and intact distal pulses.  Exam reveals no gallop and no friction rub.   No murmur heard. Pulmonary/Chest: Effort normal and breath sounds normal. No respiratory distress. She has no wheezes. She has no rales.  Abdominal: Soft. She exhibits no distension. There is no tenderness. There is no guarding.  Musculoskeletal: She exhibits edema (bilateral 2+). She exhibits no tenderness.  Neurological: She is alert and oriented to person, place, and time.  Skin: Skin is warm and dry. No rash noted. She is not diaphoretic. No erythema.  Nursing note and vitals reviewed.   ED Course  Procedures (including critical care time) Labs Review Labs Reviewed  COMPREHENSIVE METABOLIC PANEL - Abnormal; Notable for the following:    Total Protein 5.6 (*)    Albumin 2.8 (*)    All other components within normal limits  CBC WITH DIFFERENTIAL/PLATELET - Abnormal; Notable for the following:    Hemoglobin 11.8 (*)    All other components within normal limits  BRAIN NATRIURETIC PEPTIDE - Abnormal; Notable for the following:    B Natriuretic Peptide 296.5 (*)    All other components within normal limits  URINE CULTURE  URINALYSIS, ROUTINE W  REFLEX MICROSCOPIC (NOT AT [facility])  I-STAT CG4 LACTIC ACID, ED  Rosezena Sensor, ED  I-STAT CG4 LACTIC ACID, ED  Rosezena Sensor, ED    Imaging Review Dg Chest 2 View  09/08/2015  CLINICAL DATA:  Shortness of breath.  Recent history of pneumonia. EXAM: CHEST  2 VIEW COMPARISON:  08/15/2015 and 08/04/2015 FINDINGS: Patient is rotated to the right. Examination demonstrates a moderate size right effusion without significant change likely with associated atelectasis. There is a small left effusion likely with associated atelectasis unchanged. Hazy right perihilar opacification unchanged. Cannot exclude infection over the right mid to lower lung. Stable cardiomegaly. Calcified plaque over the aortic arch. Moderate degenerate change of the spine with biphasic curvature of the spine. Several thoracic spine compression fractures without significant change. IMPRESSION: Moderate size right effusion and small left effusion likely with associated basilar atelectasis without significant change. Cannot exclude infection over  the right mid to lower lung. Moderate stable cardiomegaly. Stable thoracic spine compression fractures. Electronically Signed   By: Elberta Fortis M.D.   On: 09/08/2015 18:50   I have personally reviewed and evaluated these images and lab results as part of my medical decision-making.   EKG Interpretation   Date/Time:  Friday September 08 2015 17:57:36 EST Ventricular Rate:  92 PR Interval:    QRS Duration: 86 QT Interval:  484 QTC Calculation: 599 R Axis:   52 Text Interpretation:  Atrial fibrillation RSR' in V1 or V2, probably  normal variant Borderline T abnormalities, anterior leads Prolonged QT  interval No significant change since last tracing Confirmed by [facility]  MD, Ora Mcnatt ([id number]) on 09/09/2015 2:16:26 AM      MDM   Final diagnoses:  Bilateral pleural effusion, chronic  Acute on chronic diastolic (congestive) heart failure (HCC), mild   82 year old female with a  history of hypertension, hypercholesterolemia, persistent atrial fibrillation not on anticoagulation due to fall risk, Parkinson's disease, diastolic heart failure presents with concern for leg swelling.  Patient reports leg swelling, and facility reports concern for shortness of breath.Patient is chronically on 2 L of oxygen at home, and oxygen saturation is unchanged.  Differential diagnosis for dyspnea includes pneumonia, ACS, pneumothorax, CHF exacerbation.  X-ray was obtained and was similar to prior, with bilateral pleural effusions, and atelectasis cannot exclude infection. Patient denies any significant cough, is afebrile well-appearing, with no leukocytosis or left shift, and doubt pneumonia.  Also doubt pneumonia in setting of XR which is not significantly changed from prior.  EKG shows atrial fibrillation. Troponin x2 negative and pt without chest pain and doubt ACS. BNP is mildly elevated at 295 however decreased from prior. Patient has no asymmetric leg swelling and have low suspicion for DVT, and given no tachycardia or new hypoxia have low suspicion for pulmonary embolus.  Blood pressures ranging between 110s and 90s systolic, which on chart review of recent visits appears baseline. Pt BP and recent outpatient cardiology visit was 90s systolic.   Patient does have  JVD, bilateral lower extremity edema, and in setting of pleural effusions, was given 20 mg of IV Lasix for mild CHF exacerbation. Patient without any respiratory distress, breathing comfortably in the emergency department, observed for several hours, and able to ambulate in the emergency department without dyspnea or difficulty. Initial question of low O2 on ambulation, however with ill-fitting pulse ox and inability to visualize waveform with portable O2 monitor, and given O2 improved after readjusting and patient was asymptomatic without dyspnea at this time feel reading was likely artifact.  Pt to be discharged back to facility and  follow up closely with PCP.     Alvira Monday, MD 09/09/15 [phone]

## 2015-09-08 NOTE — ED Notes (Addendum)
Pt ambulated in hallway with walker (per usual walking device) 02 ranged from 84%-94% on 2L (per usual home 02), HR 80s. Pulse ox readjusted on pts finger to read a steady low-mid 90s 02 level, no wave form visible due to using manual pulse oximeter. Pt complaint of no sob/dizzinies/difficulty walking while ambulating.

## 2015-09-08 NOTE — ED Notes (Addendum)
[facility] with c/o SOB and tachypnea worsening today.  Hx of recent pneumonia, CHF, and bilateral effusions.  Pt in NAD, A&O to baseline.

## 2015-09-08 NOTE — ED Notes (Signed)
Patient transported to X-ray 

## 2015-09-08 NOTE — ED Notes (Signed)
Heart healthy dinner tray ordered 

## 2015-09-08 NOTE — ED Notes (Signed)
MD at bedside. 

## 2015-10-26 ENCOUNTER — Encounter: Payer: Self-pay | Admitting: Cardiovascular Disease

## 2015-10-26 ENCOUNTER — Telehealth: Payer: Self-pay | Admitting: Cardiovascular Disease

## 2015-10-26 NOTE — Telephone Encounter (Signed)
New Message  This message is to inform you that we have made 3 consecutive attempts to contact the patient since 09/19/2015. We have also mailed a letter to the patient to inform them to call in and schedule. Although we were unsuccessful in these attempts we wanted you to be aware of our efforts. Will remove the patient from our recall list at this time.   Shanti [facility]

## 2016-07-23 ENCOUNTER — Emergency Department (HOSPITAL_COMMUNITY)
Admission: EM | Admit: 2016-07-23 | Discharge: 2016-07-23 | Disposition: A | Discharge status: Home / Self Care | Payer: Commercial Managed Care - HMO | Attending: Emergency Medicine | Admitting: Emergency Medicine

## 2016-07-23 ENCOUNTER — Encounter (HOSPITAL_COMMUNITY): Payer: Self-pay | Admitting: Emergency Medicine

## 2016-07-23 DIAGNOSIS — Y999 Unspecified external cause status: Secondary | ICD-10-CM | POA: Insufficient documentation

## 2016-07-23 DIAGNOSIS — Y9301 Activity, walking, marching and hiking: Secondary | ICD-10-CM | POA: Diagnosis not present

## 2016-07-23 DIAGNOSIS — Y92129 Unspecified place in nursing home as the place of occurrence of the external cause: Secondary | ICD-10-CM | POA: Insufficient documentation

## 2016-07-23 DIAGNOSIS — I5033 Acute on chronic diastolic (congestive) heart failure: Secondary | ICD-10-CM | POA: Diagnosis not present

## 2016-07-23 DIAGNOSIS — W010XXA Fall on same level from slipping, tripping and stumbling without subsequent striking against object, initial encounter: Secondary | ICD-10-CM | POA: Diagnosis not present

## 2016-07-23 DIAGNOSIS — G20 Parkinson's disease: Secondary | ICD-10-CM | POA: Diagnosis not present

## 2016-07-23 DIAGNOSIS — Z7982 Long term (current) use of aspirin: Secondary | ICD-10-CM | POA: Insufficient documentation

## 2016-07-23 DIAGNOSIS — S51812A Laceration without foreign body of left forearm, initial encounter: Secondary | ICD-10-CM | POA: Diagnosis not present

## 2016-07-23 DIAGNOSIS — I110 Hypertensive heart disease with heart failure: Secondary | ICD-10-CM | POA: Diagnosis not present

## 2016-07-23 DIAGNOSIS — Z79899 Other long term (current) drug therapy: Secondary | ICD-10-CM | POA: Insufficient documentation

## 2016-07-23 DIAGNOSIS — S59912A Unspecified injury of left forearm, initial encounter: Secondary | ICD-10-CM | POA: Diagnosis present

## 2016-07-23 DIAGNOSIS — Z8659 Personal history of other mental and behavioral disorders: Secondary | ICD-10-CM

## 2016-07-23 MED ORDER — BACITRACIN ZINC 500 UNIT/GM EX OINT
TOPICAL_OINTMENT | Freq: Two times a day (BID) | CUTANEOUS | Status: DC
  Administered 2016-07-23: 1 via TOPICAL
  Filled 2016-07-23: qty 0.9

## 2016-07-23 MED ORDER — TETANUS-DIPHTH-ACELL PERTUSSIS 5-2.5-18.5 LF-MCG/0.5 IM SUSP
0.5000 mL | Freq: Once | INTRAMUSCULAR | Status: DC
  Filled 2016-07-23: qty 0.5

## 2016-07-23 MED ORDER — ACETAMINOPHEN 325 MG PO TABS
650.0000 mg | ORAL_TABLET | Freq: Once | ORAL | Status: AC
Start: 2016-07-23 — End: 2016-07-23
  Administered 2016-07-23: 650 mg via ORAL
  Filled 2016-07-23: qty 2

## 2016-07-23 NOTE — ED Notes (Signed)
[facility] called to give report. Receive voicemail and unable to to give report at this time.

## 2016-07-23 NOTE — ED Notes (Signed)
Spoke with daughter in law on phone and states pt wil,l need [facility] because unable to assist pt in car due to o2 use and difficulty ambulating. [facility] has been called.

## 2016-07-23 NOTE — Discharge Instructions (Signed)
It was our pleasure to provide your ER care today - we hope that you feel better.  Fall precautions.  Keep skin tear area very clean, change dressing as need.  Follow up with primary care doctor in the coming week.  Return to ER if worse, new symptoms, new or severe pain, other medical emergency.

## 2016-07-23 NOTE — ED Notes (Addendum)
Patient undressed, in gown, on monitor, continuous pulse oximetry, blood pressure cuff and oxygen [place] (2L) 

## 2016-07-23 NOTE — ED Triage Notes (Signed)
[facility] for a fall that occurred last night. Pt states she was walking to the rR and tripped with her walker and states she fell onto her back. Pt denies hitting her head or any LOC. However pt does have history of dementia. Pt also takes Plavix.

## 2016-07-23 NOTE — ED Provider Notes (Addendum)
[place] DEPT Provider Note   CSN: [id number] Arrival date & time: 07/23/16  0805     History   Chief Complaint Chief Complaint  Patient presents with  . Fall    HPI Alejandra Bailey is a 82 y.o. female.  Patient s/p fall last pm.  Was walking with walker. Tripped. Denies faintness or dizziness prior to fall. No loc. EMS called this [facility] to transport for evaluation. Patient w hx dementia, limited historian, level 5 caveat.  Other than noting skin tear to left forearm, patient currently denies pain/injury.  Tetanus unknown.    The history is provided by the patient and the EMS personnel. The history is limited by the condition of the patient.  Fall  Pertinent negatives include no chest pain and no headaches.    Past Medical History:  Diagnosis Date  . Arthritis    "thumbs" (05/05/2015)  . Atrial fibrillation ([place])   . Chronic back pain greater than 3 months duration   . Family history of adverse reaction to anesthesia   . High cholesterol   . History of measles   . History of mumps   . History of pelvic fracture 2011   "no OR"  . Hypertension   . Parkinson disease ([place])    pt denies this hx on 05/05/2015  . Permanent atrial fibrillation (HCC)    CHADS2VASC=5, no anticoag 2nd fall risk  . Tremor     Patient Active Problem List   Diagnosis Date Noted  . Parkinson disease ([place]) 08/22/2015  . Atrial fibrillation with rapid ventricular response ([place])   . Acute on chronic diastolic (congestive) heart failure 08/09/2015  . Dementia 05/05/2015  . Pressure ulcer 04/22/2015  . Hypokalemia 04/21/2015  . Cellulitis of left leg   . Chronic a-fib ([place])    . Senile osteoporosis 03/23/2015  . Chronic constipation 03/23/2015  . Thoracic compression fracture ([place]) 03/23/2015  . Acute suppurative otitis media with spontaneous rupture of eardrum 05/24/2014  . Otalgia of both ears 05/24/2014  . Essential hypertension, benign 11/03/2013  . Edema 11/03/2013  . Vitamin D  deficiency 11/03/2013  . Dyslipidemia 11/03/2013  . Tremors of nervous system 11/03/2013    Past Surgical History:  Procedure Laterality Date  . CATARACT EXTRACTION W/ INTRAOCULAR LENS  IMPLANT, BILATERAL Bilateral   . TONSILLECTOMY  1934    OB History    No data available       Home Medications    Prior to Admission medications   Medication Sig Start Date End Date Taking? Authorizing Provider  aspirin EC 81 MG tablet Take 81 mg by mouth daily.    Historical Provider, MD  Cholecalciferol (VITAMIN D-3 PO) Take 1,000 Units by mouth daily.     Historical Provider, MD  clopidogrel (PLAVIX) 75 MG tablet Take 75 mg by mouth daily.    Historical Provider, MD  donepezil (ARICEPT) 5 MG tablet Take 5 mg by mouth daily. 07/23/15   Historical Provider, MD  furosemide (LASIX) 20 MG tablet Take 2 tablets (40 mg total) by mouth daily. 08/22/15   Erlene Quan, PA-C  hydrOXYzine (ATARAX/VISTARIL) 25 MG tablet Take 25 mg by mouth every 6 (six) hours as needed for itching.    Historical Provider, MD  isosorbide mononitrate (IMDUR) 30 MG 24 hr tablet Take 1 tablet (30 mg total) by mouth daily. 08/11/15   Thurnell Lose, MD  metoprolol tartrate (LOPRESSOR) 25 MG tablet Take 1 tablet (25 mg total) by mouth 2 (two) times daily.  08/11/15   Thurnell Lose, MD  Omega-3 Fatty Acids (FISH OIL PO) Take 1,000 mg by mouth daily.     Historical Provider, MD  potassium chloride SA (K-DUR,KLOR-CON) 20 MEQ tablet Take 40 mEq by mouth daily.    Historical Provider, MD  primidone (MYSOLINE) 50 MG tablet Take two tablets by mouth once daily Patient taking differently: Take 100 mg by mouth daily.  01/19/15   Lauree Chandler, NP  saccharomyces boulardii (FLORASTOR) 250 MG capsule Take 1 capsule (250 mg total) by mouth 2 (two) times daily. 05/07/15   Cristal Ford, DO    Family History Family History  Problem Relation Age of Onset  . Stroke Father   . Breast cancer Mother   . Gout Son   . Heart Problems Son      Triple Bypass  . Cancer Maternal Grandmother 40    breast cancer  . Cancer Maternal Grandfather 52    stomach cancer    Social History Social History  Substance Use Topics  . Smoking status: Never Smoker  . Smokeless tobacco: Never Used  . Alcohol use No     Allergies   Patient has no known allergies.   Review of Systems Review of Systems  Unable to perform ROS: Dementia  Cardiovascular: Negative for chest pain.  Musculoskeletal: Negative for back pain and neck pain.  Neurological: Negative for headaches.  level 5 caveat. Dementia.    Physical Exam Updated Vital Signs BP (!) 162/108 (BP Location: Right Arm)   Pulse 97   Temp 98.4 F (36.9 C) (Oral)   Resp (!) 32   SpO2 98%   Physical Exam  Constitutional: She appears well-developed and well-nourished. No distress.  HENT:  Head: Atraumatic.  Mouth/Throat: Oropharynx is clear and moist.  Eyes: Conjunctivae are normal. Pupils are equal, round, and reactive to light. No scleral icterus.  Neck: Neck supple. No tracheal deviation present.  No bruits  Cardiovascular: Normal rate, regular rhythm, normal heart sounds and intact distal pulses.   Pulmonary/Chest: Effort normal and breath sounds normal. No respiratory distress. She exhibits no tenderness.  Abdominal: Soft. Normal appearance. She exhibits no distension. There is no tenderness.  Musculoskeletal: She exhibits no edema.  CTLS spine, non tender, aligned, no step off.  Skin tear left forearm, superficial. No focal bony tenderness to area.  Good rom bil ext without pain or focal bony tenderness. Distal pulses palp bil  Neurological: She is alert.  Awake and alert. Moves bil ext purposefully with good strength. Mental status described as being c/w baseline.   Skin: Skin is warm and dry. No rash noted. She is not diaphoretic.  Psychiatric: She has a normal mood and affect.  Nursing note and vitals reviewed.    ED Treatments / Results  Labs (all labs  ordered are listed, but only abnormal results are displayed) Labs Reviewed - No data to display  EKG  EKG Interpretation None       Radiology No results found.  Procedures Procedures (including critical care time)  Medications Ordered in ED Medications - No data to display   Initial Impression / Assessment and Plan / ED Course  I have reviewed the triage vital signs and the nursing notes.  Pertinent labs & imaging results that were available during my care of the patient were reviewed by me and considered in my medical decision making (see chart for details).  Clinical Course    Reviewed nursing notes and prior charts for additional history.  Patient currently content appearing, denies pain.   Tylenol po.  Tetanus im.  Wound cleaned, bacitracin and sterile dressing.  No new c/o or pain. Spine nt. Mental status remains at baseline.  Patient currently appears stable for d/c.     Final Clinical Impressions(s) / ED Diagnoses   Final diagnoses:  None    New Prescriptions New Prescriptions   No medications on file     Lajean Saver, MD 07/23/16 [place], MD 07/23/16 [phone]

## 2016-08-26 DIAGNOSIS — Z9181 History of falling: Secondary | ICD-10-CM | POA: Diagnosis not present

## 2016-08-26 DIAGNOSIS — F0390 Unspecified dementia without behavioral disturbance: Secondary | ICD-10-CM | POA: Diagnosis not present

## 2016-08-26 DIAGNOSIS — R2681 Unsteadiness on feet: Secondary | ICD-10-CM | POA: Diagnosis not present

## 2016-08-26 DIAGNOSIS — I110 Hypertensive heart disease with heart failure: Secondary | ICD-10-CM | POA: Diagnosis not present

## 2016-08-26 DIAGNOSIS — I509 Heart failure, unspecified: Secondary | ICD-10-CM | POA: Diagnosis not present

## 2016-08-26 DIAGNOSIS — I4891 Unspecified atrial fibrillation: Secondary | ICD-10-CM | POA: Diagnosis not present

## 2016-08-26 DIAGNOSIS — M1990 Unspecified osteoarthritis, unspecified site: Secondary | ICD-10-CM | POA: Diagnosis not present

## 2016-08-30 DIAGNOSIS — Z0000 Encounter for general adult medical examination without abnormal findings: Secondary | ICD-10-CM | POA: Diagnosis not present

## 2016-09-20 DIAGNOSIS — I4891 Unspecified atrial fibrillation: Secondary | ICD-10-CM | POA: Diagnosis not present

## 2016-09-20 DIAGNOSIS — E7800 Pure hypercholesterolemia, unspecified: Secondary | ICD-10-CM | POA: Diagnosis not present

## 2016-09-20 DIAGNOSIS — I5020 Unspecified systolic (congestive) heart failure: Secondary | ICD-10-CM | POA: Diagnosis not present

## 2016-09-20 DIAGNOSIS — E559 Vitamin D deficiency, unspecified: Secondary | ICD-10-CM | POA: Diagnosis not present

## 2016-09-20 DIAGNOSIS — E876 Hypokalemia: Secondary | ICD-10-CM | POA: Diagnosis not present

## 2016-09-20 DIAGNOSIS — R296 Repeated falls: Secondary | ICD-10-CM | POA: Diagnosis not present

## 2016-09-20 DIAGNOSIS — R251 Tremor, unspecified: Secondary | ICD-10-CM | POA: Diagnosis not present

## 2016-09-20 DIAGNOSIS — I10 Essential (primary) hypertension: Secondary | ICD-10-CM | POA: Diagnosis not present

## 2016-09-23 DIAGNOSIS — R251 Tremor, unspecified: Secondary | ICD-10-CM | POA: Diagnosis not present

## 2016-09-23 DIAGNOSIS — E7800 Pure hypercholesterolemia, unspecified: Secondary | ICD-10-CM | POA: Diagnosis not present

## 2016-09-23 DIAGNOSIS — I10 Essential (primary) hypertension: Secondary | ICD-10-CM | POA: Diagnosis not present

## 2016-09-23 DIAGNOSIS — I4891 Unspecified atrial fibrillation: Secondary | ICD-10-CM | POA: Diagnosis not present

## 2016-09-23 DIAGNOSIS — E559 Vitamin D deficiency, unspecified: Secondary | ICD-10-CM | POA: Diagnosis not present

## 2016-09-23 DIAGNOSIS — I5020 Unspecified systolic (congestive) heart failure: Secondary | ICD-10-CM | POA: Diagnosis not present

## 2016-09-23 DIAGNOSIS — E876 Hypokalemia: Secondary | ICD-10-CM | POA: Diagnosis not present

## 2016-10-18 DIAGNOSIS — E559 Vitamin D deficiency, unspecified: Secondary | ICD-10-CM | POA: Diagnosis not present

## 2016-10-18 DIAGNOSIS — I4891 Unspecified atrial fibrillation: Secondary | ICD-10-CM | POA: Diagnosis not present

## 2016-10-18 DIAGNOSIS — E876 Hypokalemia: Secondary | ICD-10-CM | POA: Diagnosis not present

## 2016-10-18 DIAGNOSIS — E7800 Pure hypercholesterolemia, unspecified: Secondary | ICD-10-CM | POA: Diagnosis not present

## 2016-10-18 DIAGNOSIS — G4700 Insomnia, unspecified: Secondary | ICD-10-CM | POA: Diagnosis not present

## 2016-10-18 DIAGNOSIS — R251 Tremor, unspecified: Secondary | ICD-10-CM | POA: Diagnosis not present

## 2016-10-18 DIAGNOSIS — I10 Essential (primary) hypertension: Secondary | ICD-10-CM | POA: Diagnosis not present

## 2016-10-18 DIAGNOSIS — I5020 Unspecified systolic (congestive) heart failure: Secondary | ICD-10-CM | POA: Diagnosis not present

## 2016-10-18 DIAGNOSIS — R296 Repeated falls: Secondary | ICD-10-CM | POA: Diagnosis not present

## 2016-10-18 DIAGNOSIS — F0390 Unspecified dementia without behavioral disturbance: Secondary | ICD-10-CM | POA: Diagnosis not present

## 2016-10-18 DIAGNOSIS — M2550 Pain in unspecified joint: Secondary | ICD-10-CM | POA: Diagnosis not present

## 2016-10-20 DIAGNOSIS — I10 Essential (primary) hypertension: Secondary | ICD-10-CM | POA: Diagnosis not present

## 2016-10-20 DIAGNOSIS — R251 Tremor, unspecified: Secondary | ICD-10-CM | POA: Diagnosis not present

## 2016-10-20 DIAGNOSIS — E7800 Pure hypercholesterolemia, unspecified: Secondary | ICD-10-CM | POA: Diagnosis not present

## 2016-10-20 DIAGNOSIS — I5020 Unspecified systolic (congestive) heart failure: Secondary | ICD-10-CM | POA: Diagnosis not present

## 2016-10-20 DIAGNOSIS — E559 Vitamin D deficiency, unspecified: Secondary | ICD-10-CM | POA: Diagnosis not present

## 2016-10-20 DIAGNOSIS — I4891 Unspecified atrial fibrillation: Secondary | ICD-10-CM | POA: Diagnosis not present

## 2016-10-20 DIAGNOSIS — E876 Hypokalemia: Secondary | ICD-10-CM | POA: Diagnosis not present

## 2016-10-21 DIAGNOSIS — M2010 Hallux valgus (acquired), unspecified foot: Secondary | ICD-10-CM | POA: Diagnosis not present

## 2016-10-21 DIAGNOSIS — L603 Nail dystrophy: Secondary | ICD-10-CM | POA: Diagnosis not present

## 2016-10-21 DIAGNOSIS — L853 Xerosis cutis: Secondary | ICD-10-CM | POA: Diagnosis not present

## 2016-10-21 DIAGNOSIS — B351 Tinea unguium: Secondary | ICD-10-CM | POA: Diagnosis not present

## 2016-10-21 DIAGNOSIS — Q845 Enlarged and hypertrophic nails: Secondary | ICD-10-CM | POA: Diagnosis not present

## 2016-11-15 DIAGNOSIS — E559 Vitamin D deficiency, unspecified: Secondary | ICD-10-CM | POA: Diagnosis not present

## 2016-11-15 DIAGNOSIS — F0390 Unspecified dementia without behavioral disturbance: Secondary | ICD-10-CM | POA: Diagnosis not present

## 2016-11-15 DIAGNOSIS — E876 Hypokalemia: Secondary | ICD-10-CM | POA: Diagnosis not present

## 2016-11-15 DIAGNOSIS — E7800 Pure hypercholesterolemia, unspecified: Secondary | ICD-10-CM | POA: Diagnosis not present

## 2016-11-15 DIAGNOSIS — I10 Essential (primary) hypertension: Secondary | ICD-10-CM | POA: Diagnosis not present

## 2016-11-15 DIAGNOSIS — R251 Tremor, unspecified: Secondary | ICD-10-CM | POA: Diagnosis not present

## 2016-11-15 DIAGNOSIS — I4891 Unspecified atrial fibrillation: Secondary | ICD-10-CM | POA: Diagnosis not present

## 2016-11-15 DIAGNOSIS — M2550 Pain in unspecified joint: Secondary | ICD-10-CM | POA: Diagnosis not present

## 2016-11-15 DIAGNOSIS — R296 Repeated falls: Secondary | ICD-10-CM | POA: Diagnosis not present

## 2016-11-15 DIAGNOSIS — I5020 Unspecified systolic (congestive) heart failure: Secondary | ICD-10-CM | POA: Diagnosis not present

## 2016-11-15 DIAGNOSIS — G4700 Insomnia, unspecified: Secondary | ICD-10-CM | POA: Diagnosis not present

## 2016-12-20 DIAGNOSIS — M2550 Pain in unspecified joint: Secondary | ICD-10-CM | POA: Diagnosis not present

## 2016-12-20 DIAGNOSIS — I5020 Unspecified systolic (congestive) heart failure: Secondary | ICD-10-CM | POA: Diagnosis not present

## 2016-12-20 DIAGNOSIS — E7800 Pure hypercholesterolemia, unspecified: Secondary | ICD-10-CM | POA: Diagnosis not present

## 2017-01-08 DIAGNOSIS — R0989 Other specified symptoms and signs involving the circulatory and respiratory systems: Secondary | ICD-10-CM | POA: Diagnosis not present

## 2017-01-09 DIAGNOSIS — M21951 Unspecified acquired deformity of right thigh: Secondary | ICD-10-CM | POA: Diagnosis not present

## 2017-01-10 DIAGNOSIS — J159 Unspecified bacterial pneumonia: Secondary | ICD-10-CM | POA: Diagnosis not present

## 2017-01-10 DIAGNOSIS — R05 Cough: Secondary | ICD-10-CM | POA: Diagnosis not present

## 2017-01-14 DIAGNOSIS — R05 Cough: Secondary | ICD-10-CM | POA: Diagnosis not present

## 2017-01-14 DIAGNOSIS — R296 Repeated falls: Secondary | ICD-10-CM | POA: Diagnosis not present

## 2017-01-14 DIAGNOSIS — J159 Unspecified bacterial pneumonia: Secondary | ICD-10-CM | POA: Diagnosis not present

## 2017-01-14 DIAGNOSIS — E559 Vitamin D deficiency, unspecified: Secondary | ICD-10-CM | POA: Diagnosis not present

## 2017-01-14 DIAGNOSIS — E7800 Pure hypercholesterolemia, unspecified: Secondary | ICD-10-CM | POA: Diagnosis not present

## 2017-01-14 DIAGNOSIS — I4891 Unspecified atrial fibrillation: Secondary | ICD-10-CM | POA: Diagnosis not present

## 2017-01-14 DIAGNOSIS — I5020 Unspecified systolic (congestive) heart failure: Secondary | ICD-10-CM | POA: Diagnosis not present

## 2017-01-14 DIAGNOSIS — E876 Hypokalemia: Secondary | ICD-10-CM | POA: Diagnosis not present

## 2017-01-14 DIAGNOSIS — I10 Essential (primary) hypertension: Secondary | ICD-10-CM | POA: Diagnosis not present

## 2017-01-14 DIAGNOSIS — M2550 Pain in unspecified joint: Secondary | ICD-10-CM | POA: Diagnosis not present

## 2017-01-14 DIAGNOSIS — R251 Tremor, unspecified: Secondary | ICD-10-CM | POA: Diagnosis not present

## 2017-01-14 DIAGNOSIS — G4700 Insomnia, unspecified: Secondary | ICD-10-CM | POA: Diagnosis not present

## 2017-01-22 DIAGNOSIS — E785 Hyperlipidemia, unspecified: Secondary | ICD-10-CM | POA: Diagnosis not present

## 2017-01-22 DIAGNOSIS — I4891 Unspecified atrial fibrillation: Secondary | ICD-10-CM | POA: Diagnosis not present

## 2017-01-22 DIAGNOSIS — G20 Parkinson's disease: Secondary | ICD-10-CM | POA: Diagnosis not present

## 2017-01-22 DIAGNOSIS — F0390 Unspecified dementia without behavioral disturbance: Secondary | ICD-10-CM | POA: Diagnosis not present

## 2017-01-22 DIAGNOSIS — E559 Vitamin D deficiency, unspecified: Secondary | ICD-10-CM | POA: Diagnosis not present

## 2017-01-22 DIAGNOSIS — I482 Chronic atrial fibrillation: Secondary | ICD-10-CM | POA: Diagnosis not present

## 2017-01-22 DIAGNOSIS — I10 Essential (primary) hypertension: Secondary | ICD-10-CM | POA: Diagnosis not present

## 2017-02-07 DIAGNOSIS — G20 Parkinson's disease: Secondary | ICD-10-CM | POA: Diagnosis not present

## 2017-02-07 DIAGNOSIS — I482 Chronic atrial fibrillation: Secondary | ICD-10-CM | POA: Diagnosis not present

## 2017-02-07 DIAGNOSIS — L989 Disorder of the skin and subcutaneous tissue, unspecified: Secondary | ICD-10-CM | POA: Diagnosis not present

## 2017-02-07 DIAGNOSIS — F0390 Unspecified dementia without behavioral disturbance: Secondary | ICD-10-CM | POA: Diagnosis not present

## 2017-02-12 DIAGNOSIS — W19XXXD Unspecified fall, subsequent encounter: Secondary | ICD-10-CM | POA: Diagnosis not present

## 2017-02-12 DIAGNOSIS — I482 Chronic atrial fibrillation: Secondary | ICD-10-CM | POA: Diagnosis not present

## 2017-02-12 DIAGNOSIS — G20 Parkinson's disease: Secondary | ICD-10-CM | POA: Diagnosis not present

## 2017-02-12 DIAGNOSIS — F0390 Unspecified dementia without behavioral disturbance: Secondary | ICD-10-CM | POA: Diagnosis not present

## 2017-02-24 DIAGNOSIS — F0390 Unspecified dementia without behavioral disturbance: Secondary | ICD-10-CM | POA: Diagnosis not present

## 2017-02-24 DIAGNOSIS — I10 Essential (primary) hypertension: Secondary | ICD-10-CM | POA: Diagnosis not present

## 2017-02-24 DIAGNOSIS — E559 Vitamin D deficiency, unspecified: Secondary | ICD-10-CM | POA: Diagnosis not present

## 2017-02-24 DIAGNOSIS — R41841 Cognitive communication deficit: Secondary | ICD-10-CM | POA: Diagnosis not present

## 2017-02-24 DIAGNOSIS — E784 Other hyperlipidemia: Secondary | ICD-10-CM | POA: Diagnosis not present

## 2017-02-24 DIAGNOSIS — M6281 Muscle weakness (generalized): Secondary | ICD-10-CM | POA: Diagnosis not present

## 2017-02-24 DIAGNOSIS — R1311 Dysphagia, oral phase: Secondary | ICD-10-CM | POA: Diagnosis not present

## 2017-02-24 DIAGNOSIS — R296 Repeated falls: Secondary | ICD-10-CM | POA: Diagnosis not present

## 2017-02-24 DIAGNOSIS — R278 Other lack of coordination: Secondary | ICD-10-CM | POA: Diagnosis not present

## 2017-03-05 DIAGNOSIS — I10 Essential (primary) hypertension: Secondary | ICD-10-CM | POA: Diagnosis not present

## 2017-03-05 DIAGNOSIS — E785 Hyperlipidemia, unspecified: Secondary | ICD-10-CM | POA: Diagnosis not present

## 2017-03-05 DIAGNOSIS — I4891 Unspecified atrial fibrillation: Secondary | ICD-10-CM | POA: Diagnosis not present

## 2017-03-05 DIAGNOSIS — F0390 Unspecified dementia without behavioral disturbance: Secondary | ICD-10-CM | POA: Diagnosis not present

## 2017-04-23 DIAGNOSIS — I10 Essential (primary) hypertension: Secondary | ICD-10-CM | POA: Diagnosis not present

## 2017-04-23 DIAGNOSIS — F0390 Unspecified dementia without behavioral disturbance: Secondary | ICD-10-CM | POA: Diagnosis not present

## 2017-04-23 DIAGNOSIS — I4891 Unspecified atrial fibrillation: Secondary | ICD-10-CM | POA: Diagnosis not present

## 2017-05-02 DIAGNOSIS — I482 Chronic atrial fibrillation: Secondary | ICD-10-CM | POA: Diagnosis not present

## 2017-05-02 DIAGNOSIS — W19XXXD Unspecified fall, subsequent encounter: Secondary | ICD-10-CM | POA: Diagnosis not present

## 2017-05-02 DIAGNOSIS — F0390 Unspecified dementia without behavioral disturbance: Secondary | ICD-10-CM | POA: Diagnosis not present

## 2017-05-02 DIAGNOSIS — G20 Parkinson's disease: Secondary | ICD-10-CM | POA: Diagnosis not present

## 2017-05-14 DIAGNOSIS — R531 Weakness: Secondary | ICD-10-CM | POA: Diagnosis not present

## 2017-05-14 DIAGNOSIS — L89140 Pressure ulcer of left lower back, unstageable: Secondary | ICD-10-CM | POA: Diagnosis not present

## 2017-05-14 DIAGNOSIS — W19XXXD Unspecified fall, subsequent encounter: Secondary | ICD-10-CM | POA: Diagnosis not present

## 2017-05-14 DIAGNOSIS — I10 Essential (primary) hypertension: Secondary | ICD-10-CM | POA: Diagnosis not present

## 2017-05-14 DIAGNOSIS — G20 Parkinson's disease: Secondary | ICD-10-CM | POA: Diagnosis not present

## 2017-05-22 DIAGNOSIS — L89140 Pressure ulcer of left lower back, unstageable: Secondary | ICD-10-CM | POA: Diagnosis not present

## 2017-05-28 DIAGNOSIS — L89140 Pressure ulcer of left lower back, unstageable: Secondary | ICD-10-CM | POA: Diagnosis not present

## 2017-06-04 DIAGNOSIS — L89140 Pressure ulcer of left lower back, unstageable: Secondary | ICD-10-CM | POA: Diagnosis not present

## 2017-06-11 DIAGNOSIS — L89140 Pressure ulcer of left lower back, unstageable: Secondary | ICD-10-CM | POA: Diagnosis not present

## 2017-06-18 DIAGNOSIS — F0390 Unspecified dementia without behavioral disturbance: Secondary | ICD-10-CM | POA: Diagnosis not present

## 2017-06-18 DIAGNOSIS — M6281 Muscle weakness (generalized): Secondary | ICD-10-CM | POA: Diagnosis not present

## 2017-06-18 DIAGNOSIS — R278 Other lack of coordination: Secondary | ICD-10-CM | POA: Diagnosis not present

## 2017-06-18 DIAGNOSIS — R41841 Cognitive communication deficit: Secondary | ICD-10-CM | POA: Diagnosis not present

## 2017-06-18 DIAGNOSIS — I482 Chronic atrial fibrillation: Secondary | ICD-10-CM | POA: Diagnosis not present

## 2017-06-18 DIAGNOSIS — L89140 Pressure ulcer of left lower back, unstageable: Secondary | ICD-10-CM | POA: Diagnosis not present

## 2017-06-18 DIAGNOSIS — E559 Vitamin D deficiency, unspecified: Secondary | ICD-10-CM | POA: Diagnosis not present

## 2017-06-18 DIAGNOSIS — R296 Repeated falls: Secondary | ICD-10-CM | POA: Diagnosis not present

## 2017-06-18 DIAGNOSIS — I10 Essential (primary) hypertension: Secondary | ICD-10-CM | POA: Diagnosis not present

## 2017-06-18 DIAGNOSIS — R1311 Dysphagia, oral phase: Secondary | ICD-10-CM | POA: Diagnosis not present

## 2017-06-20 DIAGNOSIS — T148XXD Other injury of unspecified body region, subsequent encounter: Secondary | ICD-10-CM | POA: Diagnosis not present

## 2017-06-20 DIAGNOSIS — I482 Chronic atrial fibrillation: Secondary | ICD-10-CM | POA: Diagnosis not present

## 2017-06-20 DIAGNOSIS — G20 Parkinson's disease: Secondary | ICD-10-CM | POA: Diagnosis not present

## 2017-06-20 DIAGNOSIS — F0390 Unspecified dementia without behavioral disturbance: Secondary | ICD-10-CM | POA: Diagnosis not present

## 2017-06-25 DIAGNOSIS — L89140 Pressure ulcer of left lower back, unstageable: Secondary | ICD-10-CM | POA: Diagnosis not present

## 2017-07-19 IMAGING — CT CT ANGIO CHEST
2 of 9 series · 16 of 46 positions shown · IV contrast (omnipaque)
Comparison: None.

CLINICAL DATA: Upper back pain since [REDACTED]

EXAM:
CT ANGIOGRAPHY CHEST, ABDOMEN AND PELVIS
TECHNIQUE: Multidetector CT imaging through the chest, abdomen and pelvis was
performed using the standard protocol during bolus administration of
intravenous contrast. Multiplanar reconstructed images and MIPs were
obtained and reviewed to evaluate the vascular anatomy.
CONTRAST:  80mL OMNIPAQUE IOHEXOL 350 MG/ML SOLN

[Series 6: coronal mpr · coronal · 0.68mm/px · 3 of 106 slices shown]
[im 27/106  soft-tissue]
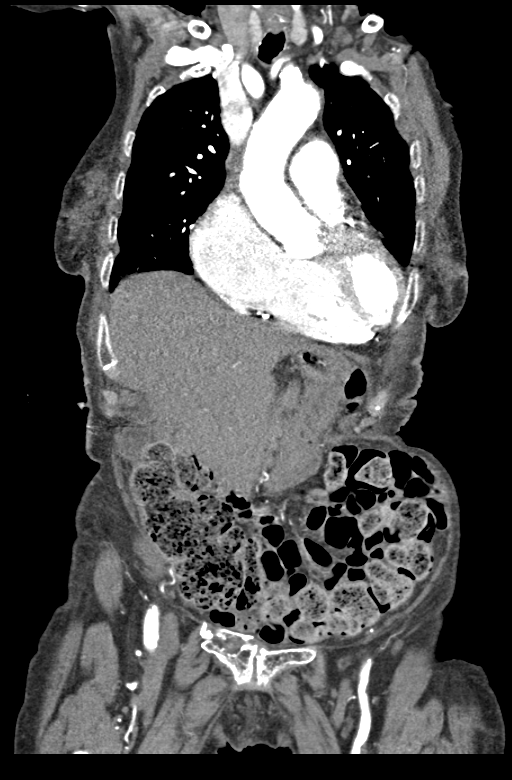
[im 53/106  soft-tissue]
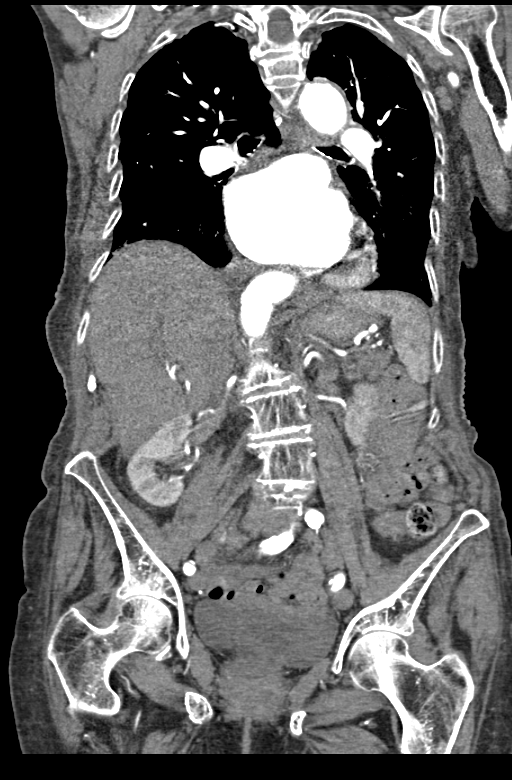
[im 79/106  soft-tissue]
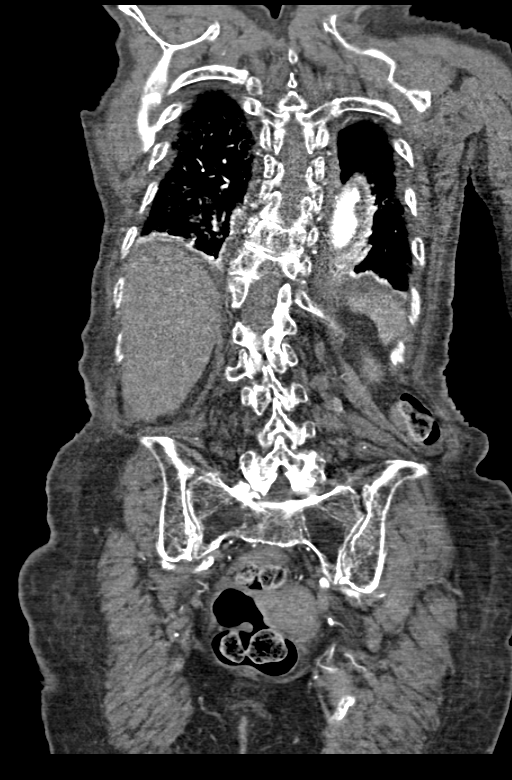

[Series 7: dissection 2.0 i30f 1 · axial · 0.59mm/px · z∈[-658,-206]mm · 13 of 256 slices shown]
[im 15/256  lung]
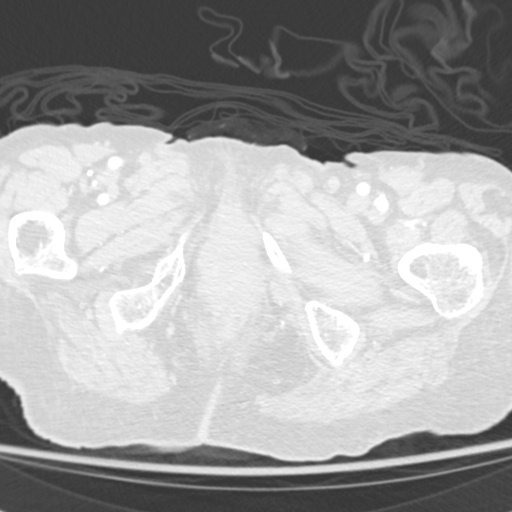
[im 29/256  soft-tissue]
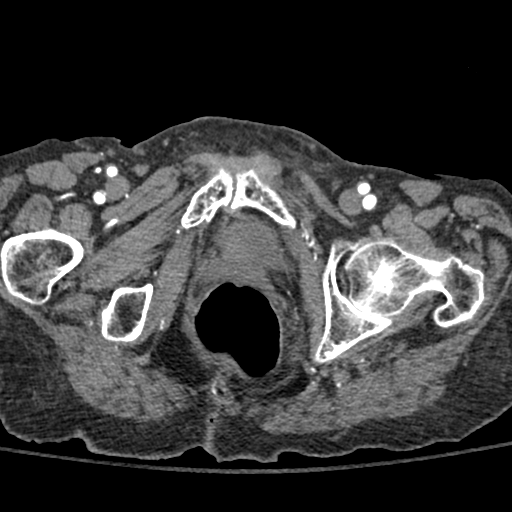
[im 57/256  lung]
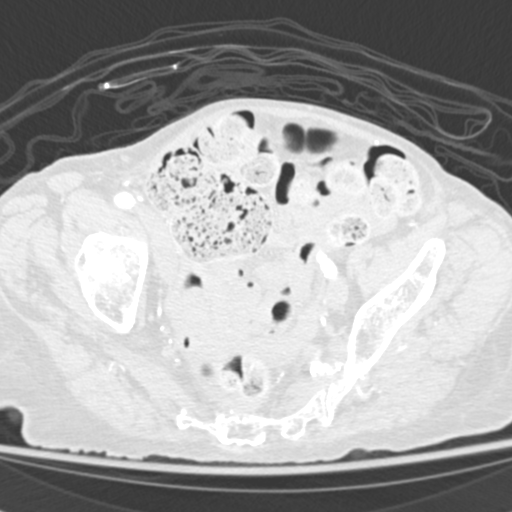
[im 71/256  soft-tissue]
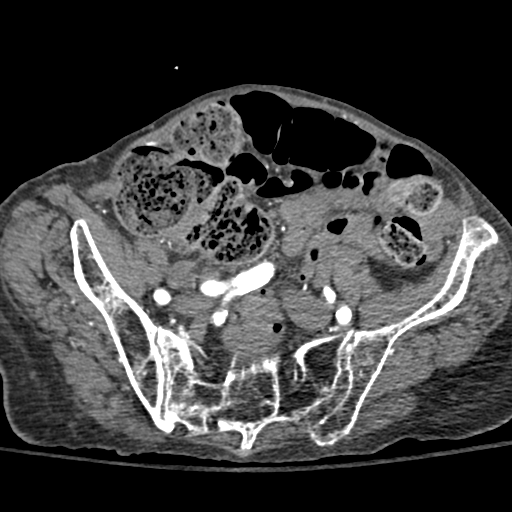
[im 86/256  lung]
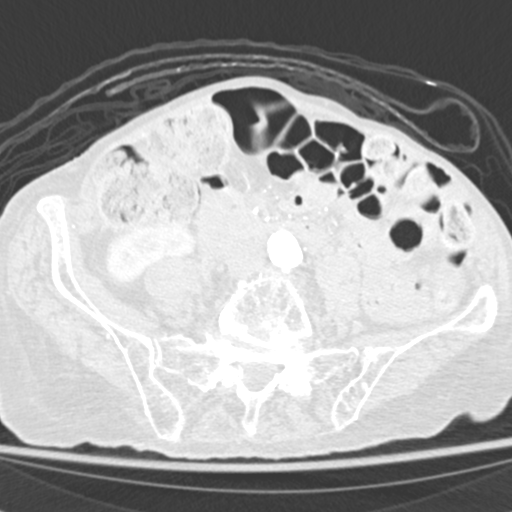
[im 114/256  soft-tissue]
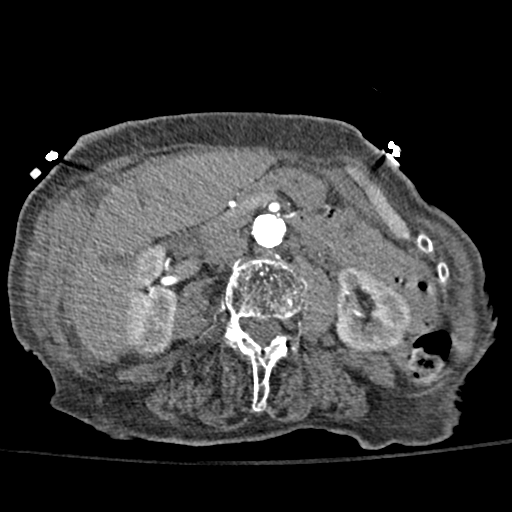
[im 128/256  lung]
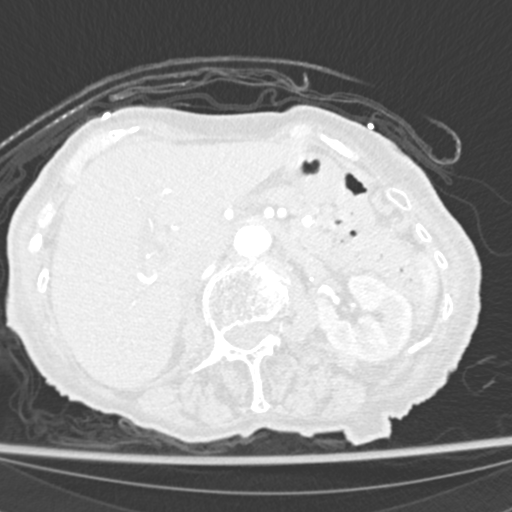
[im 142/256  soft-tissue]
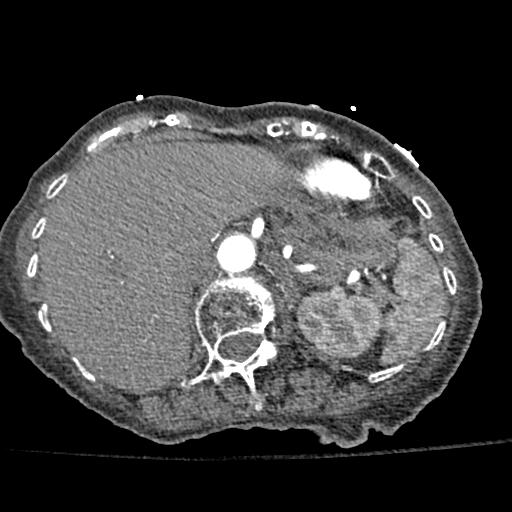
[im 171/256  lung]
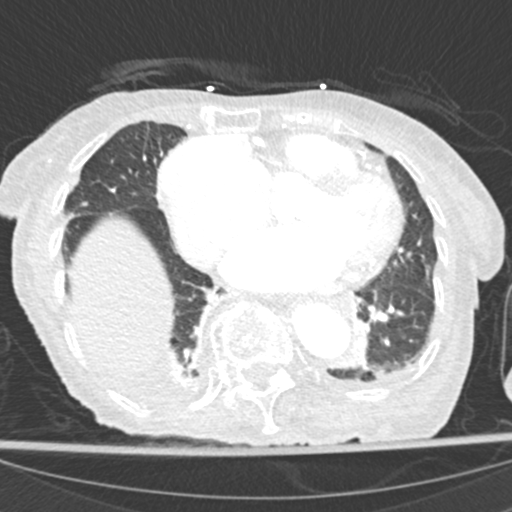
[im 185/256  soft-tissue]
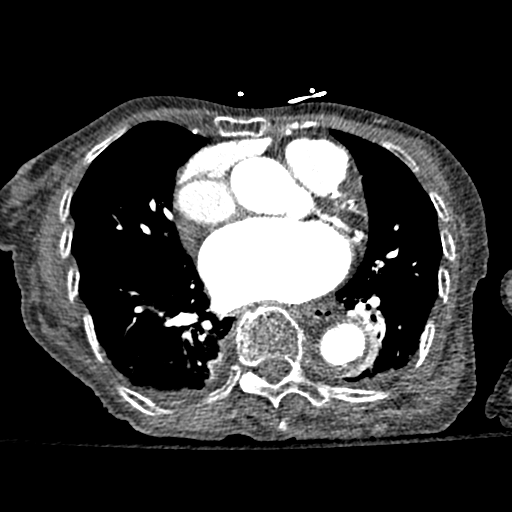
[im 199/256  lung]
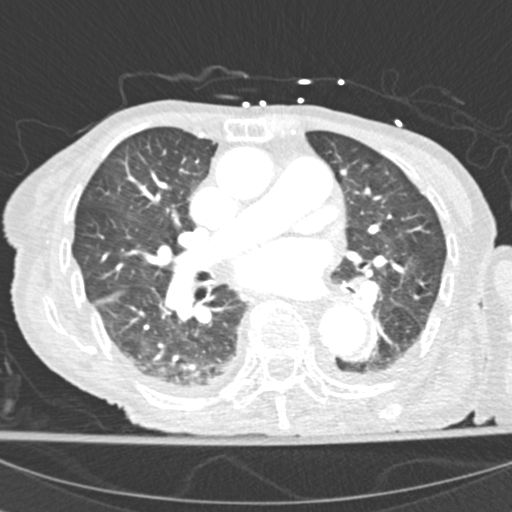
[im 227/256  soft-tissue]
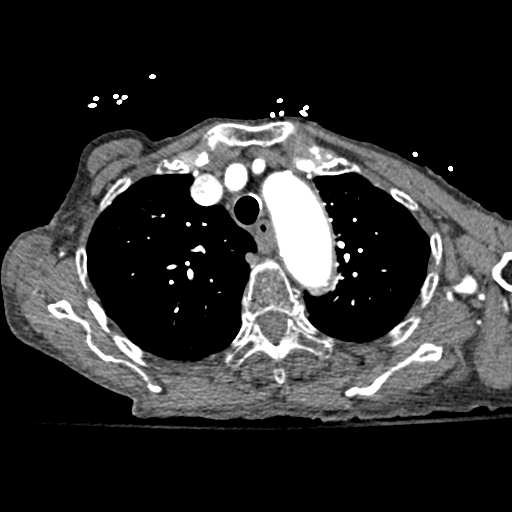
[im 241/256  lung]
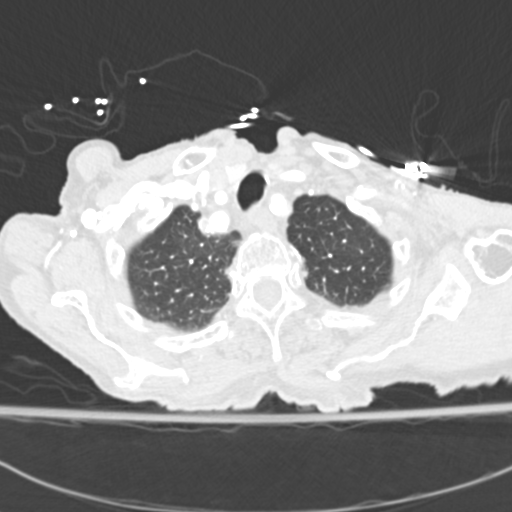

[16 of 46 positions shown; findings below may reference images not displayed]

FINDINGS: CTA CHEST

Four-chamber cardiomegaly. Moderate coronary and aortic
calcifications. The thoracic aorta is tortuous, without aneurysm.
There are small bilateral pleural effusions.

CTA via right arm contrast injection. SVC is patent. There is reflux
of contrast from the right atrium into central hepatic veins.
Dilated central pulmonary arteries indicating pulmonary
hypertension. Satisfactory opacification of pulmonary arteries
noted, and there is no evidence of pulmonary emboli. Patent
bilateral pulmonary veins. Adequate contrast opacification of the
thoracic aorta with no evidence of dissection, aneurysm, or
stenosis. There is classic 3-vessel brachiocephalic arch anatomy
without proximal stenosis.

No hilar or mediastinal adenopathy. Dependent atelectasis
posteriorly in both lower lobes, right greater than left. No focal
airspace consolidation. Compression fracture deformities of T6, T7,
T8, T10, and T11, age indeterminate. Old healed sternal fracture.

Review of the MIP images confirms the above findings.

CTA ABDOMEN AND PELVIS

Arterial findings:

Aorta: No dissection, aneurysm, or stenosis. Moderate calcified
plaque particularly in the infrarenal segment.

Celiac axis:         Patent

Superior mesenteric: Patent

Left renal: Duplicated, superior dominant, both widely patent

Right renal:         Duplicated, superior dominant, both patent.

Inferior mesenteric: Patent

Left iliac: Atheromatous plaque through the common iliac without
stenosis. Mildly tortuous internal and external iliac branches
without aneurysm, dissection, or significant stenosis.

Right iliac: Mildly tortuous with scattered plaque. No aneurysm,
dissection, or stenosis.

Venous findings:     Dedicated venous phase imaging not obtained.

Review of the MIP images confirms the above findings.

Nonvascular findings: Unremarkable arterial phase evaluation of
liver, spleen, pancreas, kidneys, adrenal glands. Stomach, small
bowel, and colon are nondilated. Urinary bladder incompletely
distended. No ascites. No free air. No adenopathy localized.
Spondylitic changes throughout the lumbar spine. Grade 1
anterolisthesis L5-S1 probably secondary to advanced facet disease;
no pars defect. Old healed fracture deformity of the superior right
pubic ramus.
IMPRESSION: 1. Negative for aortic dissection or acute pulmonary embolus.
2. Multiple thoracic spine compression fracture deformities, of
uncertain chronicity. If symptoms referable to this area persist,
elective outpatient MR [place] be useful for further characterization
and treatment planning.
3. Atherosclerosis, including aortoiliac and coronary artery
disease. Please note that although the presence of coronary artery
calcium documents the presence of coronary artery disease, the
severity of this disease and any potential stenosis cannot be
assessed on this non-gated CT examination. Assessment for potential
risk factor modification, dietary therapy or pharmacologic therapy
may be warranted, if clinically indicated.
4. Four-chamber cardiomegaly with findings of pulmonary
hypertension.

## 2017-10-10 ENCOUNTER — Other Ambulatory Visit (HOSPITAL_COMMUNITY): Payer: Self-pay | Admitting: Internal Medicine

## 2017-10-10 DIAGNOSIS — R1310 Dysphagia, unspecified: Secondary | ICD-10-CM

## 2017-10-13 ENCOUNTER — Ambulatory Visit (HOSPITAL_COMMUNITY)
Admission: RE | Admit: 2017-10-13 | Discharge: 2017-10-13 | Disposition: A | Discharge status: Home / Self Care | Payer: 59 | Source: Ambulatory Visit | Attending: Internal Medicine | Admitting: Internal Medicine

## 2017-10-13 DIAGNOSIS — R1310 Dysphagia, unspecified: Secondary | ICD-10-CM | POA: Diagnosis present

## 2018-01-06 ENCOUNTER — Non-Acute Institutional Stay: Payer: Self-pay | Admitting: Hospice and Palliative Medicine

## 2018-01-07 ENCOUNTER — Non-Acute Institutional Stay: Payer: Self-pay | Admitting: Hospice and Palliative Medicine

## 2018-01-07 DIAGNOSIS — Z515 Encounter for palliative care: Secondary | ICD-10-CM

## 2018-01-07 NOTE — Progress Notes (Signed)
Came to facility to see patient. Was informed that patient had expired.

## 2018-01-24 DEATH — deceased
